# Patient Record
Sex: Female | Born: 1975 | Race: White | Hispanic: No | Marital: Married | Smoking: Current every day smoker
Health system: Southern US, Community
[De-identification: ages and names within clinical notes are randomized; demographics above are authoritative.]

## PROBLEM LIST (undated history)

## (undated) DIAGNOSIS — E119 Type 2 diabetes mellitus without complications: Secondary | ICD-10-CM

## (undated) DIAGNOSIS — R7303 Prediabetes: Secondary | ICD-10-CM

## (undated) DIAGNOSIS — E2839 Other primary ovarian failure: Secondary | ICD-10-CM

## (undated) DIAGNOSIS — K219 Gastro-esophageal reflux disease without esophagitis: Secondary | ICD-10-CM

## (undated) DIAGNOSIS — Z8719 Personal history of other diseases of the digestive system: Secondary | ICD-10-CM

## (undated) DIAGNOSIS — T7840XA Allergy, unspecified, initial encounter: Secondary | ICD-10-CM

## (undated) DIAGNOSIS — F17200 Nicotine dependence, unspecified, uncomplicated: Secondary | ICD-10-CM

## (undated) DIAGNOSIS — N281 Cyst of kidney, acquired: Secondary | ICD-10-CM

## (undated) DIAGNOSIS — E7439 Other disorders of intestinal carbohydrate absorption: Secondary | ICD-10-CM

## (undated) DIAGNOSIS — I10 Essential (primary) hypertension: Secondary | ICD-10-CM

## (undated) HISTORY — DX: Type 2 diabetes mellitus without complications: E11.9

## (undated) HISTORY — DX: Gastro-esophageal reflux disease without esophagitis: K21.9

## (undated) HISTORY — PX: CHOLECYSTECTOMY: SHX55

## (undated) HISTORY — DX: Essential (primary) hypertension: I10

## (undated) HISTORY — DX: Other primary ovarian failure: E28.39

## (undated) HISTORY — PX: MANDIBLE FRACTURE SURGERY: SHX706

## (undated) HISTORY — DX: Other disorders of intestinal carbohydrate absorption: E74.39

## (undated) HISTORY — DX: Allergy, unspecified, initial encounter: T78.40XA

## (undated) HISTORY — PX: ABDOMINAL HYSTERECTOMY: SHX81

## (undated) HISTORY — DX: Nicotine dependence, unspecified, uncomplicated: F17.200

## (undated) HISTORY — DX: Cyst of kidney, acquired: N28.1

## (undated) HISTORY — DX: Personal history of other diseases of the digestive system: Z87.19

---

## 1998-02-16 ENCOUNTER — Other Ambulatory Visit: Admission: RE | Admit: 1998-02-16 | Discharge: 1998-02-16 | Payer: Self-pay | Admitting: Obstetrics and Gynecology

## 1998-08-28 ENCOUNTER — Encounter: Admission: RE | Admit: 1998-08-28 | Discharge: 1998-11-26 | Payer: Self-pay | Admitting: Obstetrics and Gynecology

## 1998-11-04 ENCOUNTER — Inpatient Hospital Stay (HOSPITAL_COMMUNITY): Admission: AD | Admit: 1998-11-04 | Discharge: 1998-11-07 | Payer: Self-pay | Admitting: Obstetrics and Gynecology

## 1998-12-15 ENCOUNTER — Other Ambulatory Visit: Admission: RE | Admit: 1998-12-15 | Discharge: 1998-12-15 | Payer: Self-pay | Admitting: Obstetrics and Gynecology

## 1999-10-20 ENCOUNTER — Encounter: Payer: Self-pay | Admitting: Family Medicine

## 1999-10-20 ENCOUNTER — Ambulatory Visit (HOSPITAL_COMMUNITY): Admission: RE | Admit: 1999-10-20 | Discharge: 1999-10-20 | Payer: Self-pay | Admitting: Family Medicine

## 2000-02-03 ENCOUNTER — Other Ambulatory Visit: Admission: RE | Admit: 2000-02-03 | Discharge: 2000-02-03 | Payer: Self-pay | Admitting: *Deleted

## 2000-02-23 ENCOUNTER — Encounter: Payer: Self-pay | Admitting: *Deleted

## 2000-02-23 ENCOUNTER — Encounter: Admission: RE | Admit: 2000-02-23 | Discharge: 2000-02-23 | Payer: Self-pay | Admitting: *Deleted

## 2001-03-19 ENCOUNTER — Other Ambulatory Visit: Admission: RE | Admit: 2001-03-19 | Discharge: 2001-03-19 | Payer: Self-pay | Admitting: Obstetrics and Gynecology

## 2001-03-19 ENCOUNTER — Other Ambulatory Visit: Admission: RE | Admit: 2001-03-19 | Discharge: 2001-03-19 | Payer: Self-pay | Admitting: *Deleted

## 2002-04-25 ENCOUNTER — Other Ambulatory Visit: Admission: RE | Admit: 2002-04-25 | Discharge: 2002-04-25 | Payer: Self-pay | Admitting: Obstetrics and Gynecology

## 2003-04-28 ENCOUNTER — Other Ambulatory Visit: Admission: RE | Admit: 2003-04-28 | Discharge: 2003-04-28 | Payer: Self-pay | Admitting: Obstetrics and Gynecology

## 2003-09-02 ENCOUNTER — Emergency Department (HOSPITAL_COMMUNITY): Admission: EM | Admit: 2003-09-02 | Discharge: 2003-09-02 | Payer: Self-pay | Admitting: Emergency Medicine

## 2005-10-03 ENCOUNTER — Encounter: Admission: RE | Admit: 2005-10-03 | Discharge: 2006-01-01 | Payer: Self-pay | Admitting: Obstetrics and Gynecology

## 2005-11-25 ENCOUNTER — Inpatient Hospital Stay (HOSPITAL_COMMUNITY): Admission: AD | Admit: 2005-11-25 | Discharge: 2005-11-25 | Payer: Self-pay | Admitting: Obstetrics & Gynecology

## 2006-01-16 ENCOUNTER — Inpatient Hospital Stay (HOSPITAL_COMMUNITY): Admission: AD | Admit: 2006-01-16 | Discharge: 2006-01-19 | Payer: Self-pay | Admitting: Obstetrics and Gynecology

## 2006-01-16 ENCOUNTER — Encounter (INDEPENDENT_AMBULATORY_CARE_PROVIDER_SITE_OTHER): Payer: Self-pay | Admitting: *Deleted

## 2006-05-29 ENCOUNTER — Encounter: Admission: RE | Admit: 2006-05-29 | Discharge: 2006-05-29 | Payer: Self-pay | Admitting: Gastroenterology

## 2006-06-08 ENCOUNTER — Encounter: Admission: RE | Admit: 2006-06-08 | Discharge: 2006-06-08 | Payer: Self-pay | Admitting: Gastroenterology

## 2006-07-20 ENCOUNTER — Ambulatory Visit (HOSPITAL_COMMUNITY): Admission: RE | Admit: 2006-07-20 | Discharge: 2006-07-21 | Payer: Self-pay | Admitting: Surgery

## 2006-07-20 ENCOUNTER — Encounter (INDEPENDENT_AMBULATORY_CARE_PROVIDER_SITE_OTHER): Payer: Self-pay | Admitting: Surgery

## 2006-07-26 ENCOUNTER — Emergency Department (HOSPITAL_COMMUNITY): Admission: EM | Admit: 2006-07-26 | Discharge: 2006-07-26 | Payer: Self-pay | Admitting: Emergency Medicine

## 2008-05-14 ENCOUNTER — Encounter: Payer: Self-pay | Admitting: Gastroenterology

## 2008-05-15 ENCOUNTER — Encounter: Payer: Self-pay | Admitting: Gastroenterology

## 2008-05-16 ENCOUNTER — Encounter: Payer: Self-pay | Admitting: Gastroenterology

## 2008-05-16 ENCOUNTER — Telehealth (INDEPENDENT_AMBULATORY_CARE_PROVIDER_SITE_OTHER): Payer: Self-pay | Admitting: *Deleted

## 2008-05-16 DIAGNOSIS — R109 Unspecified abdominal pain: Secondary | ICD-10-CM | POA: Insufficient documentation

## 2008-05-19 ENCOUNTER — Encounter: Payer: Self-pay | Admitting: Gastroenterology

## 2008-06-12 ENCOUNTER — Ambulatory Visit (HOSPITAL_COMMUNITY): Admission: RE | Admit: 2008-06-12 | Discharge: 2008-06-12 | Payer: Self-pay | Admitting: Gastroenterology

## 2008-06-12 ENCOUNTER — Ambulatory Visit: Payer: Self-pay | Admitting: Gastroenterology

## 2008-11-27 ENCOUNTER — Emergency Department (HOSPITAL_COMMUNITY): Admission: EM | Admit: 2008-11-27 | Discharge: 2008-11-27 | Payer: Self-pay | Admitting: Emergency Medicine

## 2009-03-13 ENCOUNTER — Ambulatory Visit (HOSPITAL_COMMUNITY): Admission: RE | Admit: 2009-03-13 | Discharge: 2009-03-13 | Payer: Self-pay | Admitting: Obstetrics and Gynecology

## 2009-07-30 ENCOUNTER — Emergency Department (HOSPITAL_COMMUNITY): Admission: EM | Admit: 2009-07-30 | Discharge: 2009-07-30 | Payer: Self-pay | Admitting: Family Medicine

## 2009-11-25 ENCOUNTER — Encounter (INDEPENDENT_AMBULATORY_CARE_PROVIDER_SITE_OTHER): Payer: Self-pay | Admitting: Obstetrics and Gynecology

## 2009-11-25 ENCOUNTER — Ambulatory Visit (HOSPITAL_COMMUNITY): Admission: RE | Admit: 2009-11-25 | Discharge: 2009-11-26 | Payer: Self-pay | Admitting: Obstetrics and Gynecology

## 2010-02-07 ENCOUNTER — Encounter: Payer: Self-pay | Admitting: Obstetrics and Gynecology

## 2010-02-22 ENCOUNTER — Other Ambulatory Visit: Payer: Self-pay | Admitting: Family Medicine

## 2010-02-22 DIAGNOSIS — M25569 Pain in unspecified knee: Secondary | ICD-10-CM

## 2010-02-26 ENCOUNTER — Other Ambulatory Visit: Payer: Self-pay | Admitting: Family Medicine

## 2010-02-26 ENCOUNTER — Other Ambulatory Visit: Payer: Self-pay

## 2010-02-26 ENCOUNTER — Ambulatory Visit
Admission: RE | Admit: 2010-02-26 | Discharge: 2010-02-26 | Disposition: A | Discharge status: Home / Self Care | Payer: 59 | Source: Ambulatory Visit | Attending: Family Medicine | Admitting: Family Medicine

## 2010-02-26 DIAGNOSIS — M25569 Pain in unspecified knee: Secondary | ICD-10-CM

## 2010-03-31 LAB — CBC
HCT: 34.6 % — ABNORMAL LOW (ref 36.0–46.0)
HCT: 39.7 % (ref 36.0–46.0)
Hemoglobin: 13.4 g/dL (ref 12.0–15.0)
MCH: 31.7 pg (ref 26.0–34.0)
MCHC: 33.7 g/dL (ref 30.0–36.0)
MCV: 93.9 fL (ref 78.0–100.0)
Platelets: 233 10*3/uL (ref 150–400)
Platelets: 303 K/uL (ref 150–400)
RBC: 4.23 MIL/uL (ref 3.87–5.11)
RDW: 12.5 % (ref 11.5–15.5)
RDW: 12.7 % (ref 11.5–15.5)
WBC: 12.6 10*3/uL — ABNORMAL HIGH (ref 4.0–10.5)
WBC: 5 K/uL (ref 4.0–10.5)

## 2010-03-31 LAB — SURGICAL PCR SCREEN
MRSA, PCR: NEGATIVE
Staphylococcus aureus: POSITIVE — AB

## 2010-03-31 LAB — HCG, SERUM, QUALITATIVE: Preg, Serum: NEGATIVE

## 2010-04-09 LAB — CBC
Hemoglobin: 13.8 g/dL (ref 12.0–15.0)
MCHC: 34 g/dL (ref 30.0–36.0)
RBC: 4.28 MIL/uL (ref 3.87–5.11)

## 2010-04-29 ENCOUNTER — Other Ambulatory Visit: Payer: Self-pay | Admitting: Internal Medicine

## 2010-04-29 ENCOUNTER — Other Ambulatory Visit: Payer: 59

## 2010-04-29 DIAGNOSIS — N23 Unspecified renal colic: Secondary | ICD-10-CM

## 2010-04-29 DIAGNOSIS — M545 Low back pain: Secondary | ICD-10-CM

## 2010-05-03 ENCOUNTER — Other Ambulatory Visit: Payer: 59

## 2010-06-01 NOTE — Op Note (Signed)
[name], [name]            ACCOUNT NO.:  [id number]   MEDICAL RECORD NO.:  [id number]          PATIENT TYPE:  OIB   LOCATION:  5741                         FACILITY:  [facility]   PHYSICIAN:  Wilmon Arms. Corliss Skains, M.D. DATE OF BIRTH:  1975/03/02   DATE OF PROCEDURE:  07/20/2006  DATE OF DISCHARGE:                               OPERATIVE REPORT   PREOPERATIVE DIAGNOSIS:  1. Chronic acalculous cholecystitis.  2. External hemorrhoidal skin tag.   POSTOPERATIVE DIAGNOSIS:  1. Chronic acalculous cholecystitis.  2. External hemorrhoidal skin tag.   PROCEDURE PERFORMED:  1. Laparoscopic cholecystectomy.  2. Excision of external hemorrhoidal skin tag.   SURGEON:  Wilmon Arms. Corliss Skains, M.D.   ASSISTANT:  Cherylynn Ridges, M.D.   ANESTHESIA:  General endotracheal.   INDICATIONS:  The patient is a 35 year old female who was referred by  Dr. Bernette Redbird for several months of epigastric and right upper  quadrant abdominal pain, nausea, vomiting, bloating and belching.  This  is usually post prandial.  She has had a workup including an EGD.  Ultrasound showed no sign of gallstones.  At HIDA scan was borderline at  52%.  Due to her symptomatology and the borderline HIDA scan, she is  referred for elective cholecystectomy.  The patient also has an external  hemorrhoidal skin tag anteriorly for which she is requesting excision.   DESCRIPTION OF PROCEDURE:  The patient was brought to the operating and  placed in a supine position on the operating room table.  After an  adequate level of general anesthesia was obtained, the patient's legs  were placed in lithotomy position in the yellow fin stirrups.  Her  perineum was prepped with Betadine and draped in a sterile fashion.  The  external hemorrhoidal skin tag was seen.  The patient also has some  bilateral mildly swollen external hemorrhoids.  However, the anterior  skin tag is the largest.  These other hemorrhoids seemed a little more  acute.   The anterior skin tag was anesthetized with a 0.25% Marcaine.  The skin was opened with a scalpel.  Cautery was then used to dissect  the hemorrhoidal tissue way from the underlying sphincter.  The  hemorrhoid was sent for pathologic examination.  Cautery was used for  hemostasis.  The edges of the skin were reapproximated with 3-0 Vicryl.  A dry dressing was applied.  No internal masses were identified on the  internal examination.   We then moved to the patient's legs back to the supine position.  Her  abdomen was then prepped with Betadine and draped a in sterile fashion.  Her umbilicus was infiltrated 0.25% Marcaine.  A transverse incision was  made below the umbilicus.  Dissection was carried down to the fascia  which was opened vertically.  A stay suture of 0 Vicryl was placed  around the fascial opening.  The peritoneal cavity was bluntly entered.  Pneumoperitoneum is obtained by insufflating CO2 maintaining a maximal  pressure of 15 mmHg through the [facility] cannula.  We tilted the patient  to her left in reverse Trendelenburg position.  A 10 mm port was  placed  in the subxiphoid position and two 5 mL ports were placed in the right  upper quadrant.   The gallbladder was grasped with a clamp and elevated over the edge of  the liver.  There were some adhesions to the surface of the gallbladder.  These were taken down with cautery.  We opened peritoneum around the  hilum of the gallbladder.  We dissected around the cystic duct  circumferentially.  It was ligated and clipped distally.  The cystic  duct was quite small.  We then created a small opening in the cystic  duct.  However, we were unable to thread a Cook cholangiogram catheter  into the cystic duct due to its size and orientation.  Therefore, we  chose not to do a cholangiogram due to the small size of the cystic duct  as well as the normal liver function tests.  The cystic duct was ligated  with two clips distally and  divided.  The cystic artery was ligated  clips and divided.  Cautery was then used to remove the gallbladder from  the liver bed.  The gallbladder was placed in an EndoCatch sac.  We  inspected the right upper quadrant for hemostasis.  We irrigated  thoroughly.  No bleeding was noted.  The gallbladder was then removed through the  umbilical port site.  The stay suture was used to close umbilical  fascia.  4-0 Monocryl was used to close the skin incisions.  Steri-  Strips and clean dressings were applied.  The patient was then extubated  and brought to recovery in stable condition.  All sponge, instrument,  and needle counts were correct.      Wilmon Arms. Tsuei, M.D.  Electronically Signed     MKT/MEDQ  D:  07/20/2006  T:  07/20/2006  Job:  [id number]

## 2010-06-04 NOTE — Op Note (Signed)
[name], [name]            ACCOUNT NO.:  [id number]   MEDICAL RECORD NO.:  [id number]          PATIENT TYPE:  INP   LOCATION:  9103                          [facility]   PHYSICIAN:  Richardean Sale, M.D.   DATE OF BIRTH:  07-Feb-1975   DATE OF PROCEDURE:  01/16/2006  DATE OF DISCHARGE:                               OPERATIVE REPORT   PREOPERATIVE DIAGNOSIS:  A 39-week intrauterine pregnancy for repeat  cesarean section and desires permanent sterilization.   POSTOPERATIVE DIAGNOSIS:  A 39-week intrauterine pregnancy for repeat  cesarean section and desires permanent sterilization.   PROCEDURE:  Repeat low transverse cesarean section and bilateral partial  salpingectomy.   SURGEON:  Richardean Sale.   ASSISTANT:  Marie-Lyne Seymour Bars.   ANESTHESIA:  Spinal   COMPLICATIONS:  None.   BLOOD LOSS:  600 mL.   OPERATIVE FINDINGS:  Viable female infant cephalic presentation with  Apgars of 9 and 9.  Nuchal cord x1.  Intact placenta with three-vessel  cord sent to pathology.  Normal-appearing ovaries and fallopian tubes.   SPECIMENS:  Placenta and bilateral partial tubal segments sent to  pathology.   INDICATIONS:  Please see admission history and physical for details.  Briefly, this is a 35 year old white female, gravida 3, para 2 who is at  39-weeks' gestation with a history of 2 prior cesarean delivery who  presents today for repeat C-section.  The patient also desires permanent  sterilization, and she presents for bilateral tubal ligation as well.  Prior to the procedure, the risks, benefits, and alternatives of the  procedure were discussed with the patient in detail.  We discussed the  risks which include but are not limited to hemorrhage requiring  transfusion, infection, injury to the bowel-the bladder or other organs  which could require additional surgery, the possibility of tubal failure  and increased risk of ectopic pregnancy should pregnancy occur.  We also  discussed general surgical and anesthesia risks.  The patient voiced  understanding of all of the above and desires to proceed.  Informed  consent was obtained before proceeding to the OR.   PROCEDURE:  The patient was taken to the operating room where she was  given a spinal anesthetic.  She was then prepped and draped in the usual  sterile fashion with Betadine, and a Foley catheter was placed.  Ten mL  of half percent plain Marcaine were injected in the area where the  incision was to be made, and a Pfannenstiel skin incision was then made  through the patient's prior incision.  This was carried down sharply to  the fascia.  The fascia was incised in midline, and the fascial incision  was extended laterally with the Mayo scissors.  The superior and  inferior aspects of the fascial incision was then grasped with Kocher  clamps, elevated and the underlying muscles were dissected off with both  sharp and blunt dissection.  The muscles were then separated in the  midline.  The peritoneum was identified, grasped between two hemostats  and entered sharply.  This incision was then extended superiorly and  inferiorly with good visualization  of the bladder.  The bladder blade  was then inserted.  It was noted that the lower uterine was very thin.  The bladder flap was created by grasping the vesicouterine peritoneum.  This was incised and transected laterally, and the bladder flap was  carried out with both sharp and blunt dissection.  The bladder blade was  then reinserted, and the lower uterine segment was incised in a  transverse fashion with a scalpel.  This incision was then extended with  bandage scissors.  Amniotomy was performed, and the fluid was clear.  The infant's head was then delivered atraumatically.  Nuchal cord x1 was  reduced.  The nose and mouth were suctioned with a bulb, and infant was  then delivered to the sterile field.  The cord was clamped and cut.  The  infant was  handed off to the waiting NICU attendants with a vigorous  cry.  Apgars were 9 and 9.  Intact placenta with three-vessel cord was  then delivered and sent to pathology.  The uterus was then cleared of  all clot and debris, and the uterine incision was repaired with a  running locked chromic suture.  Additional sutures were placed to secure  hemostasis of the uterine incision.  Once this was secured, attention  was then turned to the patient's fallopian tube.  She again expressed a  desire for permanent sterilization.  The left fallopian tube was then  identified, carried out the fimbriated end, was grasped with a Babcock  clamp and a 3-cm segment of tube was then ligated and transected and  sent to pathology labeled as left tubal segment.  The same procedure was  then carried out on the right side.  Once hemostasis of both tubes was  confirmed, attention was then turned back to the uterine incision.  The  pelvis was irrigated copiously with warm normal saline.  Any areas of  bleeding were cauterized with a Bovie.  Once hemostasis of the uterine  incision was confirmed, the subfascial areas, muscular surfaces and  peritoneal surfaces were all inspected, and any areas of bleeding were  cauterized with a Bovie.  The fascia was then closed with a running  Vicryl suture.  Subcutaneous space was irrigated and any areas of  bleeding cauterized Bovie.  Given that it was greater than 2 cm in  depth, a 2-0 plain gut suture was then used to reapproximate  subcutaneous space, and the skin was then closed with staples.   The patient tolerated the procedure very well.  All sponge, lap, needle  and instrument counts were correct x2.  She was taken to the recovery  room awake and in stable condition.  There were no complications.      Richardean Sale, M.D.  Electronically Signed     JW/MEDQ  D:  01/16/2006  T:  01/16/2006  Job:  [id number]

## 2010-06-04 NOTE — Discharge Summary (Signed)
[name], [name]            ACCOUNT NO.:  [id number]   MEDICAL RECORD NO.:  [id number]          PATIENT TYPE:  INP   LOCATION:  9103                          [facility]   PHYSICIAN:  Richardean Sale, M.D.   DATE OF BIRTH:  January 21, 1975   DATE OF ADMISSION:  01/16/2006  DATE OF DISCHARGE:  01/19/2006                               DISCHARGE SUMMARY   ADMITTING DIAGNOSES:  A 39+ week intrauterine pregnancy with history of  prior cesarean delivery.  Desires repeat cesarean section and desires  permanent sterilization.   DISCHARGE DIAGNOSES:  1. A 39+ week intrauterine pregnancy with history of prior cesarean      delivery.  Desires repeat cesarean section and desires permanent      sterilization.  2. Status post repeat cesarean section and bilateral partial      salpingectomy.   PROCEDURES:  Repeat low transverse cesarean section and bilateral  partial salpingectomy performed on January 16, 2006.   OPERATIVE FINDINGS:  Viable female infant, cephalic presentation, Apgars  9 and 9, nuchal cord x1, intact placenta with three-vessel cord.  Normal-  appearing adnexa and uterus.   HISTORY OF PRESENT ILLNESS:  Please see admission History and Physical  for details.  Briefly, this is a 35 year old female with a history of  two prior cesarean deliveries who is now at 39 weeks.  She desires  repeat cesarean section and bilateral tubal ligation.   HOSPITAL COURSE:  The patient underwent an uncomplicated repeat cesarean  delivery and tubal ligation on January 16, 2006. Her postoperative  course was remarkable for onset of lower extremity edema, the right side  greater than left, as well as right leg pain.  The patient was otherwise  afebrile throughout her hospitalization. She underwent venous Doppler  studies of the right lower extremity to rule out DVT which were  negative.   The patient on postop day #3 was able to ambulate well, was tolerating  regular diet.  Her pain was  controlled oral pain medication, and her  bleeding was minimal. Given the negative Doppler study, she was  subsequently discharged to home on postop day #3.   CONDITION:  Stable.   FOLLOWUP:  She will follow up in 24 hours for staple removal as well as  a repeat blood pressure check as she had some mild blood pressure  elevations after delivery   INSTRUCTIONS:  She is to call for any further pain or redness in her  lower extremity, any redness or drainage from her incision, any  headache, visual change or epigastric pain, fever greater than 100.4 or  increasing pain   DISCHARGE MEDICATIONS:  1. Tylox 1-2 tablets every 4-6 hours p.r.n. pain.  2. Motrin 600 mg p.o. q.6 h. p.r.n. pain.  3. Continue prenatal vitamins.  4. Colace as needed   LABORATORY STUDIES:  Postop day #1, hemoglobin 10.0, hematocrit 29.6,  platelets 245, and white count 9.6.      Richardean Sale, M.D.  Electronically Signed     JW/MEDQ  D:  01/31/2006  T:  01/31/2006  Job:  [id number]

## 2010-11-02 LAB — CBC
HCT: 36.4
MCHC: 33.4
MCV: 82.9
Platelets: 366
RBC: 4.39
RBC: 4.93
WBC: 10.7 — ABNORMAL HIGH
WBC: 9.5

## 2010-11-02 LAB — COMPREHENSIVE METABOLIC PANEL
BUN: 12
CO2: 29
Chloride: 107
Creatinine, Ser: 0.61
GFR calc non Af Amer: >60
Glucose, Bld: 102 — ABNORMAL HIGH
Total Bilirubin: 0.3

## 2010-11-02 LAB — URINALYSIS, ROUTINE W REFLEX MICROSCOPIC
Bilirubin Urine: NEGATIVE
Ketones, ur: NEGATIVE
Nitrite: NEGATIVE
Specific Gravity, Urine: 1.011
Urobilinogen, UA: 0.2

## 2010-11-02 LAB — DIFFERENTIAL
Basophils Absolute: 0.1
Basophils Relative: 0
Lymphocytes Relative: 29
Lymphs Abs: 1.6
Monocytes Relative: 5
Neutro Abs: 6.1
Neutro Abs: 6.8
Neutrophils Relative %: 72

## 2010-11-02 LAB — I-STAT 8, (EC8 V) (CONVERTED LAB)
Acid-Base Excess: 1
BUN: 10
Chloride: 106
HCT: 46
Hemoglobin: 15.6 — ABNORMAL HIGH
Operator id: 270651
Sodium: 140
pCO2, Ven: 40.3 — ABNORMAL LOW

## 2010-11-02 LAB — HEPATIC FUNCTION PANEL
Alkaline Phosphatase: 99
Indirect Bilirubin: 0.3
Total Bilirubin: 0.5
Total Protein: 8.1

## 2010-11-02 LAB — LIPASE, BLOOD: Lipase: 23

## 2010-11-02 LAB — POCT I-STAT CREATININE: Operator id: 270651

## 2011-03-21 ENCOUNTER — Emergency Department (HOSPITAL_COMMUNITY)
Admission: EM | Admit: 2011-03-21 | Discharge: 2011-03-21 | Disposition: A | Discharge status: Home / Self Care | Payer: 59 | Attending: Emergency Medicine | Admitting: Emergency Medicine

## 2011-03-21 ENCOUNTER — Encounter (HOSPITAL_COMMUNITY): Payer: Self-pay | Admitting: *Deleted

## 2011-03-21 DIAGNOSIS — R109 Unspecified abdominal pain: Secondary | ICD-10-CM | POA: Insufficient documentation

## 2011-03-21 DIAGNOSIS — F17200 Nicotine dependence, unspecified, uncomplicated: Secondary | ICD-10-CM | POA: Insufficient documentation

## 2011-03-21 DIAGNOSIS — R112 Nausea with vomiting, unspecified: Secondary | ICD-10-CM | POA: Insufficient documentation

## 2011-03-21 LAB — POCT I-STAT, CHEM 8
Calcium, Ion: 1.09 mmol/L — ABNORMAL LOW (ref 1.12–1.32)
Glucose, Bld: 102 mg/dL — ABNORMAL HIGH (ref 70–99)
HCT: 42 % (ref 36.0–46.0)
Hemoglobin: 14.3 g/dL (ref 12.0–15.0)

## 2011-03-21 MED ORDER — MORPHINE SULFATE 4 MG/ML IJ SOLN
4.0000 mg | Freq: Once | INTRAMUSCULAR | Status: AC
  Administered 2011-03-21: 4 mg via INTRAVENOUS
  Filled 2011-03-21: qty 1

## 2011-03-21 MED ORDER — SODIUM CHLORIDE 0.9 % IV BOLUS (SEPSIS)
1000.0000 mL | Freq: Once | INTRAVENOUS | Status: AC
  Administered 2011-03-21: 1000 mL via INTRAVENOUS

## 2011-03-21 MED ORDER — METOCLOPRAMIDE HCL 10 MG PO TABS: 10.0000 mg | ORAL_TABLET | Freq: Four times a day (QID) | ORAL | Status: DC

## 2011-03-21 MED ORDER — HYOSCYAMINE SULFATE 0.5 MG/ML IJ SOLN
0.2500 mg | Freq: Once | INTRAMUSCULAR | Status: AC
  Administered 2011-03-21: 0.25 mg via INTRAVENOUS
  Filled 2011-03-21: qty 0.5

## 2011-03-21 MED ORDER — ONDANSETRON HCL 4 MG/2ML IJ SOLN
4.0000 mg | Freq: Once | INTRAMUSCULAR | Status: AC
  Administered 2011-03-21: 4 mg via INTRAVENOUS
  Filled 2011-03-21: qty 2

## 2011-03-21 NOTE — ED Provider Notes (Signed)
She feels improved, able to tolerate PO fluids without vomiting. She reports sheis ready to go home.   Rodena Medin, PA-C 03/21/11 1528

## 2011-03-21 NOTE — Discharge Instructions (Signed)
B.R.A.T. Diet Your doctor has recommended the B.R.A.T. diet for you or your child until the condition improves. This is often used to help control diarrhea and vomiting symptoms. If you or your child can tolerate clear liquids, you may have:  Bananas.   Rice.   Applesauce.   Toast (and other simple starches such as crackers, potatoes, noodles).  Be sure to avoid dairy products, meats, and fatty foods until symptoms are better. Fruit juices such as apple, grape, and prune juice can make diarrhea worse. Avoid these. Continue this diet for 2 days or as instructed by your caregiver. Document Released: 01/03/2005 Document Revised: 12/23/2010 Document Reviewed: 06/22/2006 [employer] Patient Information 2012 [employer]. 

## 2011-03-21 NOTE — ED Provider Notes (Signed)
History     CSN: [id number]  Arrival date & time 03/21/11  1040   First MD Initiated Contact with Patient 03/21/11 1130      Chief Complaint  Patient presents with  . Nausea  . Emesis  . Abdominal Pain    (Consider location/radiation/quality/duration/timing/severity/associated sxs/prior treatment) Patient is a 36 y.o. female presenting with vomiting and abdominal pain. The history is provided by the patient.  Emesis  Associated symptoms include abdominal pain. Pertinent negatives include no chills, no cough, no diarrhea, no fever and no headaches.  Abdominal Pain The primary symptoms of the illness include abdominal pain and vomiting. The primary symptoms of the illness do not include fever, shortness of breath, diarrhea, dysuria, vaginal discharge or vaginal bleeding.  Symptoms associated with the illness do not include chills, constipation or back pain.  pt with several episodes nv since last pm. Emesis clear, not bloody or bilious. Upper abd cramping comes and goes, no constant or focal abd pain. No flank pain. No gu c/o. No lower abd pain. Is having normal bms. No constipation or diarrhea. Daughter w recent nvd illness. No faintness. No cough or uri c/o. No cp or sob. No known bad food ingestion or new meds.  No fever or chills.   History reviewed. No pertinent past medical history.  Past Surgical History  Procedure Date  . Cesarean section   . Abdominal hysterectomy   . Cholecystectomy     No family history on file.  History  Substance Use Topics  . Smoking status: Current Everyday Smoker  . Smokeless tobacco: Not on file  . Alcohol Use: Yes    OB History    Grav Para Term Preterm Abortions TAB SAB Ect Mult Living                  Review of Systems  Constitutional: Negative for fever and chills.  HENT: Negative for sore throat and neck pain.   Eyes: Negative for redness.  Respiratory: Negative for cough and shortness of breath.   Cardiovascular: Negative for  chest pain.  Gastrointestinal: Positive for vomiting and abdominal pain. Negative for diarrhea and constipation.  Genitourinary: Negative for dysuria, flank pain, vaginal bleeding and vaginal discharge.  Musculoskeletal: Negative for back pain.  Skin: Negative for rash.  Neurological: Negative for headaches.  Hematological: Does not bruise/bleed easily.  Psychiatric/Behavioral: Negative for confusion.    Allergies  Review of patient's allergies indicates no known allergies.  Home Medications   Current Outpatient Rx  Name Route Sig Dispense Refill  . HYDROCODONE-ACETAMINOPHEN 5-500 MG PO TABS Oral Take 1-2 tablets by mouth every 6 (six) hours as needed. As needed for pain.    Marland Kitchen TRAMADOL HCL 50 MG PO TABS Oral Take 50 mg by mouth every 6 (six) hours as needed. As needed for pain.      BP 114/74  Pulse 113  Temp 98.3 F (36.8 C)  Resp 16  Ht 5\' 8"  (1.727 m)  Wt 190 lb (86.183 kg)  BMI 28.89 kg/m2  SpO2 98%  Physical Exam  Nursing note and vitals reviewed. Constitutional: She appears well-developed and well-nourished. No distress.  Eyes: Conjunctivae are normal. No scleral icterus.  Neck: Neck supple. No tracheal deviation present.  Cardiovascular: Regular rhythm, normal heart sounds and intact distal pulses.  Exam reveals no gallop and no friction rub.   No murmur heard. Pulmonary/Chest: Effort normal and breath sounds normal. No respiratory distress.  Abdominal: Soft. Normal appearance and bowel sounds are normal.  She exhibits no distension and no mass. There is no tenderness. There is no rebound and no guarding.  Genitourinary:       No cva tenderness  Musculoskeletal: She exhibits no edema.  Neurological: She is alert.  Skin: Skin is warm and dry. No rash noted.  Psychiatric: She has a normal mood and affect.    ED Course  Procedures (including critical care time)  Results for orders placed during the hospital encounter of 03/21/11  POCT I-STAT, CHEM 8       Component Value Range   Sodium 142  135 - 145 (mEq/L)   Potassium 3.5  3.5 - 5.1 (mEq/L)   Chloride 105  96 - 112 (mEq/L)   BUN 13  6 - 23 (mg/dL)   Creatinine, Ser 1.30  0.50 - 1.10 (mg/dL)   Glucose, Bld 865 (*) 70 - 99 (mg/dL)   Calcium, Ion 7.84 (*) 1.12 - 1.32 (mmol/L)   TCO2 26  0 - 100 (mmol/L)   Hemoglobin 14.3  12.0 - 15.0 (g/dL)   HCT 69.6  29.5 - 28.4 (%)      MDM  Iv ns bolus. Morphine iv. zofran iv.   2nd liter ns. Will move to cdu for ivf, discussed plan w cdu pa.     Suzi Roots, MD 03/22/11 [phone]

## 2011-03-21 NOTE — ED Notes (Signed)
Patient reports onset of abd pain and n/v last night.  She thinks she was exposed to stomach bug by her daughter.  She states she is thirsty and she has neck and back pain

## 2011-03-21 NOTE — ED Notes (Addendum)
Pt resting. States she feels little better but still having cramping in abdomen and back. Pt moving to CDU 7

## 2011-03-21 NOTE — ED Notes (Signed)
Patient states nausea and emesis multiple episodes after eating or drinking water.  States pain is cramping in abdomen 7-10/10 intermittent.  Denies diarrhea.  Patient child recently had diarrhea and found out today another child is becoming sick.  Patient ax4 airway intact bilateral equal chest rise and fall.

## 2011-03-22 NOTE — ED Provider Notes (Signed)
Medical screening examination/treatment/procedure(s) were conducted as a shared visit with non-physician practitioner(s) and myself.  I personally evaluated the patient during the encounter   Suzi Roots, MD 03/22/11 1435

## 2011-06-15 ENCOUNTER — Emergency Department (HOSPITAL_COMMUNITY)
Admission: EM | Admit: 2011-06-15 | Discharge: 2011-06-15 | Disposition: A | Discharge status: Home / Self Care | Payer: Self-pay | Attending: Emergency Medicine | Admitting: Emergency Medicine

## 2011-06-15 ENCOUNTER — Encounter (HOSPITAL_COMMUNITY): Payer: Self-pay | Admitting: Emergency Medicine

## 2011-06-15 ENCOUNTER — Emergency Department (HOSPITAL_COMMUNITY): Payer: Self-pay

## 2011-06-15 DIAGNOSIS — M25539 Pain in unspecified wrist: Secondary | ICD-10-CM | POA: Insufficient documentation

## 2011-06-15 DIAGNOSIS — X58XXXA Exposure to other specified factors, initial encounter: Secondary | ICD-10-CM | POA: Insufficient documentation

## 2011-06-15 DIAGNOSIS — S63509A Unspecified sprain of unspecified wrist, initial encounter: Secondary | ICD-10-CM | POA: Insufficient documentation

## 2011-06-15 NOTE — Discharge Instructions (Signed)
Wear the splint for comfort. Take ibuprofen for pain. See the Dr. of your choice for problems.   Joint Sprain A sprain is a tear or stretch in the ligaments that hold a joint together. Severe sprains may need as long as 3-6 weeks of immobilization and/or exercises to heal completely. Sprained joints should be rested and protected. If not, they can become unstable and prone to re-injury. Proper treatment can reduce your pain, shorten the period of disability, and reduce the risk of repeated injuries. TREATMENT   Rest and elevate the injured joint to reduce pain and swelling.   Apply ice packs to the injury for 20-30 minutes every 2-3 hours for the next 2-3 days.   Keep the injury wrapped in a compression bandage or splint as long as the joint is painful or as instructed by your caregiver.   Do not use the injured joint until it is completely healed to prevent re-injury and chronic instability. Follow the instructions of your caregiver.   Long-term sprain management may require exercises and/or treatment by a physical therapist. Taping or special braces may help stabilize the joint until it is completely better.  SEEK MEDICAL CARE IF:   You develop increased pain or swelling of the joint.   You develop increasing redness and warmth of the joint.   You develop a fever.   It becomes stiff.   Your hand or foot gets cold or numb.  Document Released: 02/11/2004 Document Revised: 12/23/2010 Document Reviewed: 01/21/2008 [facility] Patient Information 2012 [place], [place].  RESOURCE GUIDE  Dental Problems  Patients with Medicaid: [facility] [phone] [address]                                           [phone] [address] Phone:  [phone]                                                  Phone:  [phone]  If unable to pay or uninsured, contact:  [facility] or [facility]. to become qualified for the adult dental  clinic.  Chronic Pain Problems Contact [facility]  [phone] Patients need to be referred by their primary care doctor.  Insufficient Money for Medicine Contact [employer]:  call "211" or [facility] [phone].  No Primary Care Doctor Call [facility]  [phone] Other agencies that provide inexpensive medical care    [facility]  [phone]    [facility] Internal Medicine  [phone]    [facility]  [phone]    [facility]  [phone]    [facility]  [phone]    [facility] Child Clinic  [phone]  Psychological Services [facility] Behavioral Health  [phone] [facility] Services  [phone] [facility] Mental Health   [phone] (emergency services 9125166869)  Substance Abuse Resources [facility]  [phone] [facility] [phone] [facility] [phone] Floydene Flock [phone] Residential & Outpatient Substance Abuse Program  [phone]  Abuse/Neglect [facility] Child Abuse Hotline 940-466-3101 [facility] Child Abuse Hotline 204-209-4343 (After Hours)  Emergency Shelter [employer] ([phone]  [facility] [phone] [facility] [phone]  MRSA Hotline #:   506-418-5288    [employer] [facility] Resources  [facility]     [employer]                          [facility] Dept. [address] [place]                       [address]      [address]  Blondell Reveal Phone:  [phone]                                   Phone:  [phone]                 Phone:  [phone]  [facility] Mental Health Phone:  [phone]  [facility] Child Abuse Hotline 306-044-4043 [phone] (After Hours)

## 2011-06-15 NOTE — ED Notes (Signed)
Discharge instructions reviewed with pt; verbalizes understanding.  No questions asked; no further c/o's voiced.  Pt ambulatory to lobby.  NAD noted. 

## 2011-06-15 NOTE — ED Notes (Signed)
Pt st's she was pressing down on something this am and felt a pop in left wrist.  C/o continued pain since then

## 2011-06-15 NOTE — Progress Notes (Signed)
Orthopedic Tech Progress Note Patient Details:  [name] 1975/10/16 [id number]  Ortho Devices Type of Ortho Device: Velcro wrist splint Ortho Device/Splint Location: (L) UE Ortho Device/Splint Interventions: Application   [name] 06/15/2011, 6:00 PM

## 2011-06-15 NOTE — ED Provider Notes (Signed)
History   This chart was scribed for Flint Melter, MD by Brooks Sailors. The patient was seen in room STRE8/STRE8. Patient's care was started at 1446.   CSN: [id number]  Arrival date & time 06/15/11  1446   First MD Initiated Contact with Patient 06/15/11 1735      Chief Complaint  Patient presents with  . Wrist Injury    (Consider location/radiation/quality/duration/timing/severity/associated sxs/prior treatment) HPI [name] is a 36 y.o. female who presents to the Emergency Department complaining of left wrist pain onset this morning. Patient was applying pressure to break up noodles and hit wrist wrong. Patient had shooting pain all the way up to her elbow. Pain has been persistent since onset. Nothing makes pain better. Movement aggravates pain.    History reviewed. No pertinent past medical history.  Past Surgical History  Procedure Date  . Cesarean section   . Abdominal hysterectomy   . Cholecystectomy     No family history on file.  History  Substance Use Topics  . Smoking status: Current Everyday Smoker  . Smokeless tobacco: Not on file  . Alcohol Use: Yes    OB History    Grav Para Term Preterm Abortions TAB SAB Ect Mult Living                  Review of Systems  All other systems reviewed and are negative.    Allergies  Review of patient's allergies indicates no known allergies.  Home Medications   Current Outpatient Rx  Name Route Sig Dispense Refill  . HYDROCODONE-ACETAMINOPHEN 5-500 MG PO TABS Oral Take 1-2 tablets by mouth every 6 (six) hours as needed. As needed for pain.    . IBUPROFEN 200 MG PO TABS Oral Take 600-800 mg by mouth every 6 (six) hours as needed. For pain    . THERA M PLUS PO TABS Oral Take 1 tablet by mouth daily.    Marland Kitchen METOCLOPRAMIDE HCL 10 MG PO TABS Oral Take 1 tablet (10 mg total) by mouth every 6 (six) hours. 15 tablet 0    BP 129/88  Pulse 96  Temp(Src) 98.7 F (37.1 C) (Oral)  Resp 18  SpO2  100%  Physical Exam  Nursing note and vitals reviewed. Constitutional: She is oriented to person, place, and time. She appears well-developed and well-nourished. No distress.  HENT:  Head: Normocephalic and atraumatic.  Eyes: EOM are normal.  Neck: Neck supple. No tracheal deviation present.  Cardiovascular: Normal rate.   Pulmonary/Chest: Effort normal. No respiratory distress.  Abdominal: She exhibits no distension.  Musculoskeletal: Normal range of motion. She exhibits tenderness. She exhibits no edema.       Tenderness over the lunate. Distal ulnar tenderness. Tenderness dorsal ulnar wrist.   Neurological: She is alert and oriented to person, place, and time. No sensory deficit.  Skin: Skin is warm and dry.  Psychiatric: She has a normal mood and affect. Her behavior is normal.    ED Course  Procedures (including critical care time)  1745 Pt seen and evaluated. X-ray of wrist completed 1800 Pt informed of x-ray results. Given wrist splint for comfort   Labs Reviewed - No data to display Dg Wrist Complete Left  06/15/2011  *RADIOLOGY REPORT*  Clinical Data: Posterior left wrist pain.  LEFT WRIST - COMPLETE 3+ VIEW  Comparison: None available.  Findings: No acute bone or soft tissue abnormality is present.  The left wrist is located.  IMPRESSION: Negative left wrist.  Original  Report Authenticated By: Jamesetta Orleans. MATTERN, M.D.     1. Wrist sprain       MDM: Wrist sprain without fracture. Stable for discharge.  Plan: Home Medications-  ibuprofen ; Home TreatmentSplint when necessarys- ; Recommended follow up- PCP prn    I personally performed the services described in this documentation, which was scribed in my presence. The recorded information has been reviewed and considered.     Flint Melter, MD 06/15/11 2122

## 2011-12-14 ENCOUNTER — Telehealth: Payer: Self-pay

## 2011-12-14 NOTE — Telephone Encounter (Signed)
PT SAW [place] FOR W/C.  SHE SAID THE MEDICINE WAS NOT HELPING AND WANTED TO KNOW IF WE COULD CALL SOMETHING ELSE IN.  CALL [phone]

## 2011-12-14 NOTE — Telephone Encounter (Signed)
ERROR - THIS WAS SUPPOSED TO BE PLACED IN [facility] PHONE MESSAGES BECAUSE IT IS A W/C

## 2012-01-03 ENCOUNTER — Telehealth: Payer: Self-pay

## 2012-01-03 NOTE — Telephone Encounter (Signed)
Error

## 2012-01-19 ENCOUNTER — Telehealth: Payer: Self-pay

## 2012-01-19 NOTE — Telephone Encounter (Signed)
Pt is requesting HYDROcodone-acetaminophen (VICODIN) 5-500 MG per tablet [phone] (H)

## 2012-01-19 NOTE — Telephone Encounter (Signed)
Can you put this in [facility]man? I need chart.

## 2012-01-31 ENCOUNTER — Other Ambulatory Visit (HOSPITAL_COMMUNITY): Payer: Self-pay | Admitting: Internal Medicine

## 2012-01-31 ENCOUNTER — Ambulatory Visit (HOSPITAL_COMMUNITY)
Admission: RE | Admit: 2012-01-31 | Discharge: 2012-01-31 | Disposition: A | Discharge status: Home / Self Care | Payer: Self-pay | Source: Ambulatory Visit | Attending: Internal Medicine | Admitting: Internal Medicine

## 2012-01-31 DIAGNOSIS — N83209 Unspecified ovarian cyst, unspecified side: Secondary | ICD-10-CM | POA: Insufficient documentation

## 2012-01-31 DIAGNOSIS — Z90710 Acquired absence of both cervix and uterus: Secondary | ICD-10-CM | POA: Insufficient documentation

## 2012-01-31 DIAGNOSIS — R1032 Left lower quadrant pain: Secondary | ICD-10-CM

## 2012-01-31 DIAGNOSIS — N9489 Other specified conditions associated with female genital organs and menstrual cycle: Secondary | ICD-10-CM

## 2012-02-29 ENCOUNTER — Encounter: Payer: 59 | Admitting: Obstetrics & Gynecology

## 2012-03-14 ENCOUNTER — Ambulatory Visit (INDEPENDENT_AMBULATORY_CARE_PROVIDER_SITE_OTHER): Payer: Self-pay | Admitting: Obstetrics & Gynecology

## 2012-03-14 ENCOUNTER — Encounter: Payer: Self-pay | Admitting: Obstetrics & Gynecology

## 2012-03-14 VITALS — BP 139/91 | HR 83 | Temp 98.8°F | Ht 66.0 in | Wt 172.9 lb

## 2012-03-14 DIAGNOSIS — R109 Unspecified abdominal pain: Secondary | ICD-10-CM

## 2012-03-14 NOTE — Progress Notes (Signed)
Patient ID: [name], female   DOB: 08/09/1975, 37 y.o.   MRN: [id number]  Chief Complaint  Patient presents with  . Referral    from [facility]  . Pelvic Pain    HPI [name] is a 37 y.o. female.  [id number] No LMP recorded. Patient has had a hysterectomy. LLQ pain increasing over few months, may last for 1.5 weeks and may be cyclical but worse when standing, walking, at work. Feels some LLQ pressure which is relieved by holding that area.  HPI  History reviewed. No pertinent past medical history.  Past Surgical History  Procedure Laterality Date  . Cesarean section    . Abdominal hysterectomy    . Cholecystectomy    . Mandible fracture surgery    RATH 2011 Dr. Billy Coast, LSO, LOA, cystectomy repaired. Resection of endometriosis from l side of CS scar at [facility]. Has 3 CS Endometrial ablation Family History  Problem Relation Age of Onset  . Hypertension Mother   . Hypertension Maternal Grandmother   . Cancer Maternal Grandmother   . Cancer Maternal Grandfather   . Hypertension Paternal Grandmother   . Diabetes Paternal Grandmother     Social History History  Substance Use Topics  . Smoking status: Current Every Day Smoker -- 0.50 packs/day for 15 years    Types: Cigarettes  . Smokeless tobacco: Not on file  . Alcohol Use: Yes    No Known Allergies  Current Outpatient Prescriptions  Medication Sig Dispense Refill  . cyclobenzaprine (FLEXERIL) 5 MG tablet Take 5 mg by mouth 3 (three) times daily as needed for muscle spasms.      Marland Kitchen HYDROcodone-acetaminophen (VICODIN) 5-500 MG per tablet Take 1-2 tablets by mouth every 6 (six) hours as needed. As needed for pain.      Marland Kitchen ibuprofen (ADVIL,MOTRIN) 200 MG tablet Take 600-800 mg by mouth every 6 (six) hours as needed. For pain      . Multiple Vitamins-Minerals (MULTIVITAMINS THER. W/MINERALS) TABS Take 1 tablet by mouth daily.      . metoCLOPramide (REGLAN) 10 MG tablet Take 1 tablet (10 mg total) by mouth  every 6 (six) hours.  15 tablet  0   No current facility-administered medications for this visit.    Review of Systems Review of Systems  Constitutional: Negative for fever and appetite change.  Gastrointestinal: Negative for vomiting, diarrhea and constipation.  Genitourinary: Positive for pelvic pain. Negative for dysuria, vaginal bleeding, vaginal discharge and vaginal pain.    Blood pressure 139/91, pulse 83, temperature 98.8 F (37.1 C), temperature source Oral, height 5\' 6"  (1.676 m), weight 172 lb 14.4 oz (78.427 kg).  Physical Exam Physical Exam  Constitutional: She appears well-nourished. No distress.  Pulmonary/Chest: Effort normal. No respiratory distress.  Abdominal: Soft. She exhibits no distension and no mass. There is tenderness (llq mild). There is no rebound and no guarding.  Genitourinary: Vagina normal. No vaginal discharge found.  No mass, moderate left side tenderness.   Skin: Skin is warm and dry.  Psychiatric: She has a normal mood and affect. Her behavior is normal.    Data Reviewed Addendum    [name], MD Tue Jan 31, 2012 5:12:07 PM EST       **ADDENDUM** CREATED: 01/31/2012 17:09:57  Impression #2 below contains a typographical error, and should read  "previous hysterectomy and left oophrectomy."  **END ADDENDUM** SIGNED BY: John A. Eppie Gibson, M.D.      Study Result    *RADIOLOGY REPORT*  Clinical Data: Left lower quadrant pain. Adnexal mass on exam.  Endometriosis. Previous hysterectomy.  TRANSABDOMINAL AND TRANSVAGINAL ULTRASOUND OF PELVIS  Technique: Both transabdominal and transvaginal ultrasound  examinations of the pelvis were performed. Transabdominal  technique was performed for global imaging of the pelvis including  uterus, ovaries, adnexal regions, and pelvic cul-de-sac.  It was necessary to proceed with endovaginal exam following the  transabdominal exam to visualize the vaginal cuff and ovaries.  Comparison: None.  Findings:   Uterus: Surgically absent. Vaginal cuff is unremarkable in  appearance.  Right ovary: A complex cyst is seen which contains hypoechoic  strands with concave Services, consistent with blood clot. No  internal blood flow is seen within this lesion on color Doppler  ultrasound. This measures 3.6 cm and is has characteristics  consistent with a benign hemorrhagic cyst.  Left ovary: Not visualized, consistent with history of unilateral  oophrectomy.  Other Findings: A small amount of free fluid noted.  IMPRESSION:  1. 3.6 cm benign appearing hemorrhagic right ovarian cyst. Small  amount of free fluid also noted.  2. Previous hysterectomy and left nephrectomy.  Original Report Authenticated By: Myles Rosenthal, M.D.         Assessment    LLQ pain, s/p hysterectomy and LSO, CS scar revision. 3.6 cm r ovarian cyst     Plan    CT scan, R/O hernia RTC for result Explained I would not Rx narcotic.        [name] 03/14/2012, 3:16 PM

## 2012-03-14 NOTE — Patient Instructions (Signed)
Pelvic Pain Pelvic pain is pain below the belly button and located between your hips. Acute pain may last a few hours or days. Chronic pelvic pain may last weeks and months. The cause may be different for different types of pain. The pain may be dull or sharp, mild or severe and can interfere with your daily activities. Write down and tell your caregiver:   Exactly where the pain is located.  If it comes and goes or is there all the time.  When it happens (with sex, urination, bowel movement, etc.)  If the pain is related to your menstrual period or stress. Your caregiver will take a full history and do a complete physical exam and Pap test. CAUSES   Painful menstrual periods (dysmenorrhea).  Normal ovulation (Mittelschmertz) that occurs in the middle of the menstrual cycle every month.  The pelvic organs get engorged with blood just before the menstrual period (pelvic congestive syndrome).  Scar tissue from an infection or past surgery (pelvic adhesions).  Cancer of the female pelvic organs. When there is pain with cancer, it has been there for a long time.  The lining of the uterus (endometrium) abnormally grows in places like the pelvis and on the pelvic organs (endometriosis).  A form of endometriosis with the lining of the uterus present inside of the muscle tissue of the uterus (adenomyosis).  Fibroid tumor (noncancerous) in the uterus.  Bladder problems such as infection, bladder spasms of the muscle tissue of the bladder.  Intestinal problems (irritable bowel syndrome, colitis, an ulcer or gastrointestinal infection).  Polyps of the cervix or uterus.  Pregnancy in the tube (ectopic pregnancy).  The opening of the cervix is too small for the menstrual blood to flow through it (cervical stenosis).  Physical or sexual abuse (past or present).  Musculo-skeletal problems from poor posture, problems with the vertebrae of the lower back or the uterine pelvic muscles falling  (prolapse).  Psychological problems such as depression or stress.  IUD (intrauterine device) in the uterus. DIAGNOSIS  Tests to make a diagnosis depends on the type, location, severity and what causes the pain to occur. Tests that may be needed include:  Blood tests.  Urine tests  Ultrasound.  X-rays.  CT Scan.  MRI.  Laparoscopy.  Major surgery. TREATMENT  Treatment will depend on the cause of the pain, which includes:  Prescription or over-the-counter pain medication.  Antibiotics.  Birth control pills.  Hormone treatment.  Nerve blocking injections.  Physical therapy.  Antidepressants.  Counseling with a psychiatrist or psychologist.  Minor or major surgery. HOME CARE INSTRUCTIONS   Only take over-the-counter or prescription medicines for pain, discomfort or fever as directed by your caregiver.  Follow your caregiver's advice to treat your pain.  Rest.  Avoid sexual intercourse if it causes the pain.  Apply warm or cold compresses (which ever works best) to the pain area.  Do relaxation exercises such as yoga or meditation.  Try acupuncture.  Avoid stressful situations.  Try group therapy.  If the pain is because of a stomach/intestinal upset, drink clear liquids, eat a bland light food diet until the symptoms go away. SEEK MEDICAL CARE IF:   You need stronger prescription pain medication.  You develop pain with sexual intercourse.  You have pain with urination.  You develop a temperature of 102 F (38.9 C) with the pain.  You are still in pain after 4 hours of taking prescription medication for the pain.  You need depression medication.    Your IUD is causing pain and you want it removed. SEEK IMMEDIATE MEDICAL CARE IF:  You develop very severe pain or tenderness.  You faint, have chills, severe weakness or dehydration.  You develop heavy vaginal bleeding or passing solid tissue.  You develop a temperature of 102 F (38.9 C)  with the pain.  You have blood in the urine.  You are being physically or sexually abused.  You have uncontrolled vomiting and diarrhea.  You are depressed and afraid of harming yourself or someone else. Document Released: 02/11/2004 Document Revised: 03/28/2011 Document Reviewed: 11/08/2007 Exit[employer] Patient Information 2013 [employer].  

## 2012-03-14 NOTE — Progress Notes (Signed)
Found cyst on R ovary (only ovary left) but has left sided pain. Says the pain feels like when she had endometriosis.

## 2012-03-27 ENCOUNTER — Ambulatory Visit (HOSPITAL_COMMUNITY): Admission: RE | Admit: 2012-03-27 | Payer: Self-pay | Source: Ambulatory Visit

## 2012-04-02 ENCOUNTER — Ambulatory Visit: Payer: Self-pay | Admitting: Obstetrics & Gynecology

## 2012-04-04 ENCOUNTER — Ambulatory Visit: Payer: Self-pay | Admitting: Obstetrics & Gynecology

## 2013-02-28 IMAGING — CR DG LUMBAR SPINE COMPLETE 4+V
5 series · 5 of 5 positions shown · non-contrast
Comparison: None.

CLINICAL DATA: Low back pain.  Remote history of fall.

LUMBAR SPINE - COMPLETE 4+ VIEW

[AP]
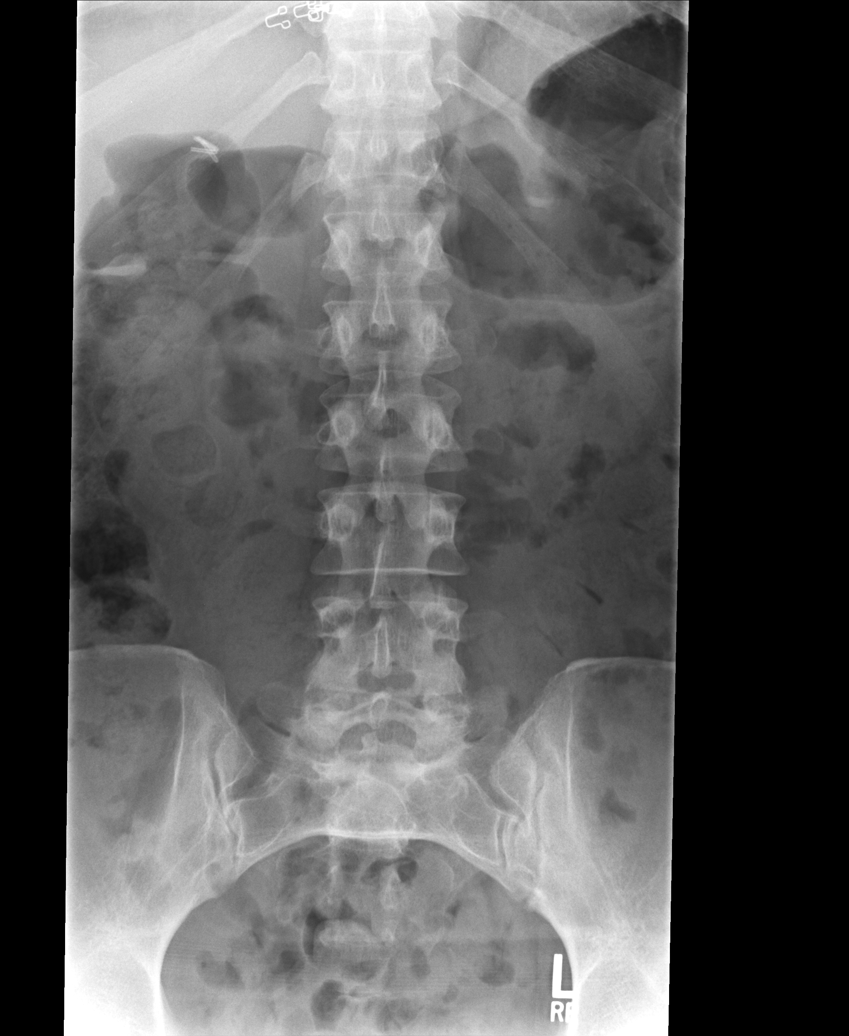

[rpo]
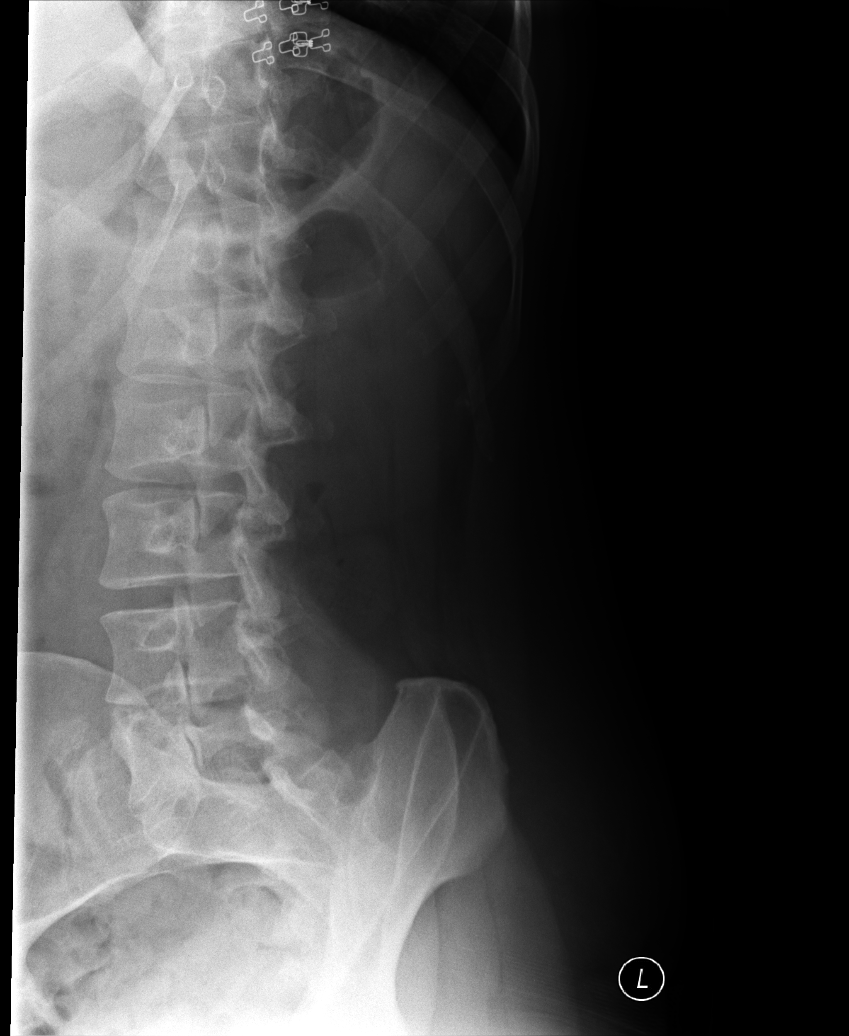

[lpo]
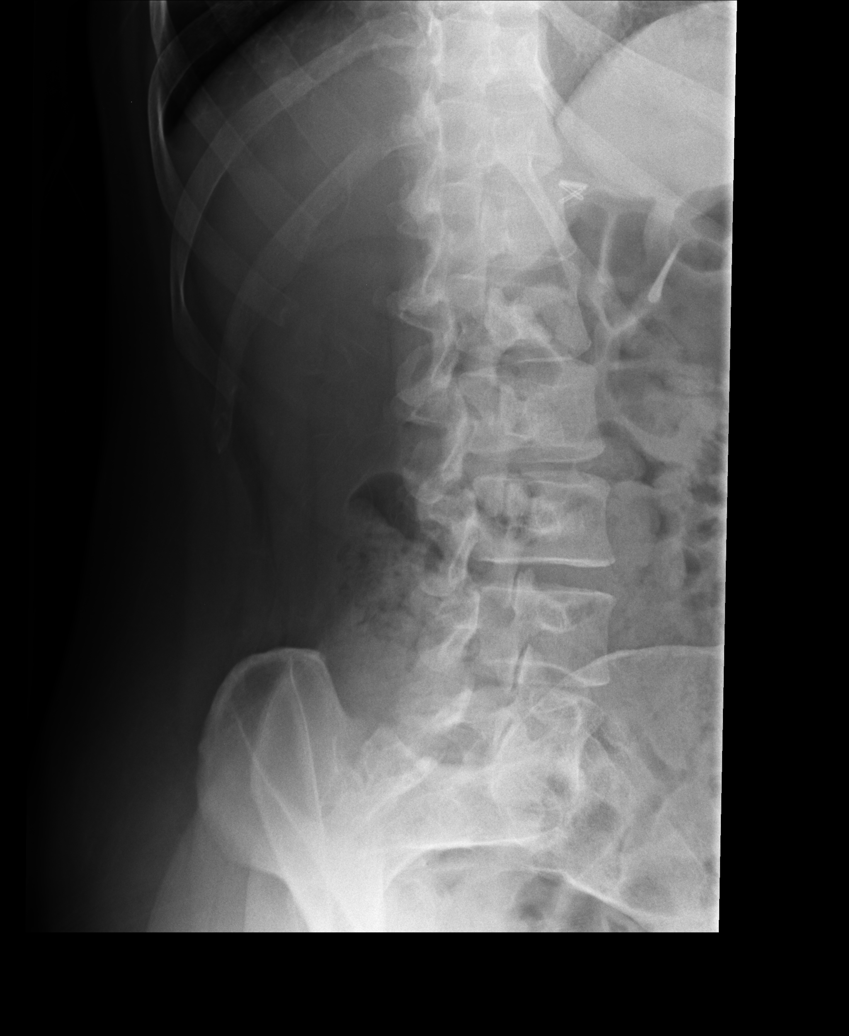

[lateral]
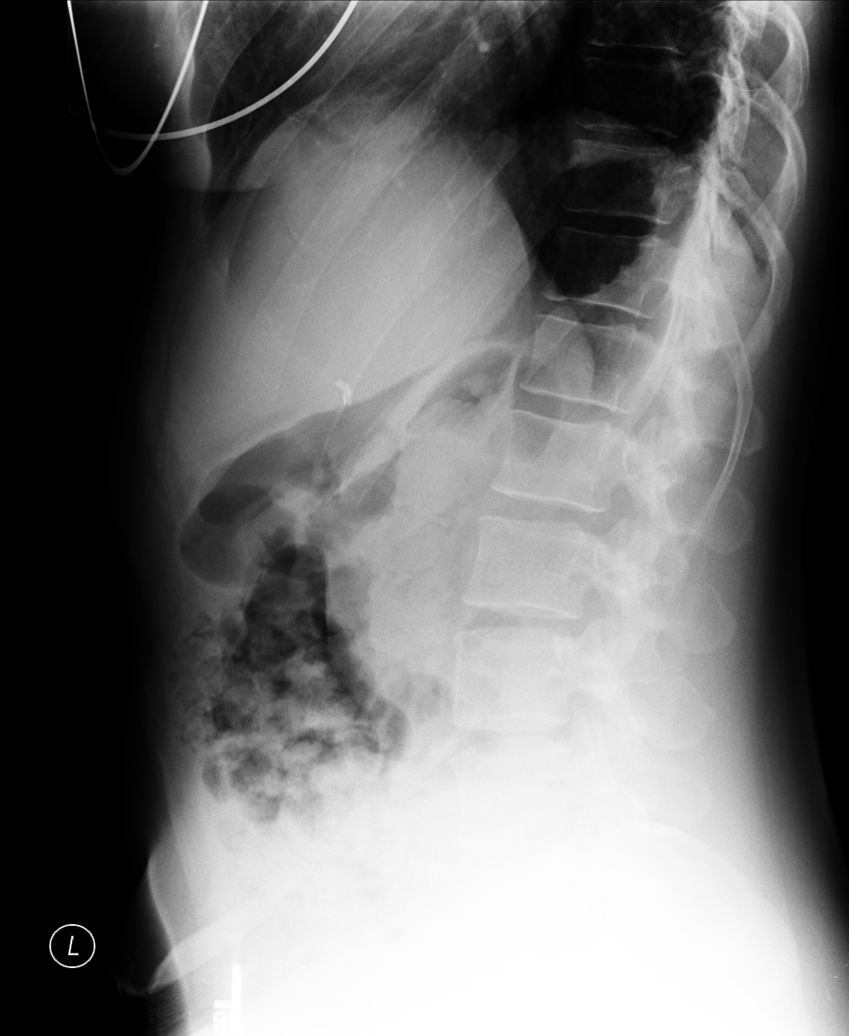

[l5 s1]
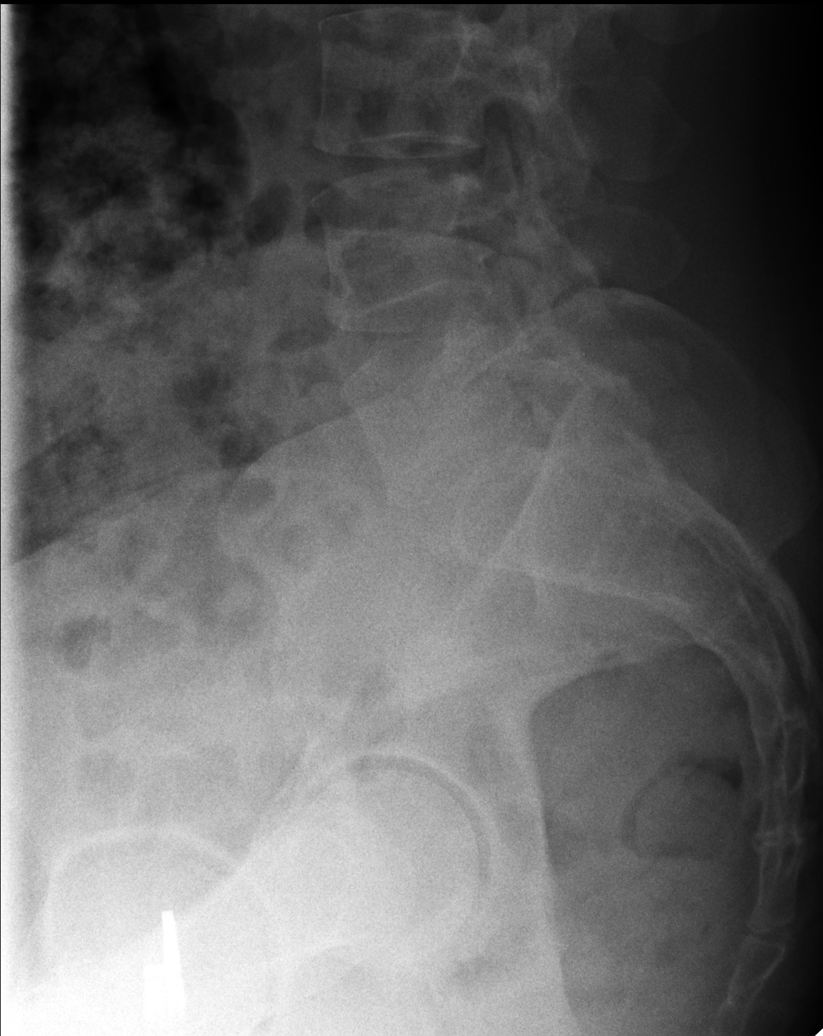

[5 of 5 positions shown; findings below may reference images not displayed]

FINDINGS: Early degenerative facet disease in the lower lumbar
spine.  No fracture or malalignment.  Disc spaces are maintained.
SI joints are symmetric and unremarkable.  The
IMPRESSION: Early degenerative facet disease at L5-S1.  No acute findings.

## 2013-11-18 ENCOUNTER — Encounter: Payer: Self-pay | Admitting: Obstetrics & Gynecology

## 2016-11-17 ENCOUNTER — Ambulatory Visit: Payer: Self-pay | Admitting: Family Medicine

## 2020-04-26 ENCOUNTER — Encounter (HOSPITAL_COMMUNITY): Payer: Self-pay | Admitting: Emergency Medicine

## 2020-04-26 ENCOUNTER — Emergency Department (HOSPITAL_COMMUNITY): Payer: Self-pay

## 2020-04-26 ENCOUNTER — Emergency Department (HOSPITAL_COMMUNITY)
Admission: EM | Admit: 2020-04-26 | Discharge: 2020-04-26 | Disposition: A | Discharge status: Home / Self Care | Payer: Self-pay | Attending: Emergency Medicine | Admitting: Emergency Medicine

## 2020-04-26 ENCOUNTER — Other Ambulatory Visit: Payer: Self-pay

## 2020-04-26 DIAGNOSIS — R10812 Left upper quadrant abdominal tenderness: Secondary | ICD-10-CM | POA: Insufficient documentation

## 2020-04-26 DIAGNOSIS — R1012 Left upper quadrant pain: Secondary | ICD-10-CM

## 2020-04-26 DIAGNOSIS — R110 Nausea: Secondary | ICD-10-CM | POA: Insufficient documentation

## 2020-04-26 LAB — URINALYSIS, ROUTINE W REFLEX MICROSCOPIC
Bilirubin Urine: NEGATIVE
Glucose, UA: NEGATIVE mg/dL
Ketones, ur: NEGATIVE mg/dL
Leukocytes,Ua: NEGATIVE
Nitrite: NEGATIVE
Protein, ur: NEGATIVE mg/dL
Specific Gravity, Urine: 1.015 (ref 1.005–1.030)
pH: 5 (ref 5.0–8.0)

## 2020-04-26 LAB — COMPREHENSIVE METABOLIC PANEL
ALT: 20 U/L (ref 0–44)
AST: 19 U/L (ref 15–41)
Albumin: 4.1 g/dL (ref 3.5–5.0)
Alkaline Phosphatase: 50 U/L (ref 38–126)
Anion gap: 7 (ref 5–15)
BUN: 11 mg/dL (ref 6–20)
CO2: 26 mmol/L (ref 22–32)
Calcium: 8.8 mg/dL — ABNORMAL LOW (ref 8.9–10.3)
Chloride: 104 mmol/L (ref 98–111)
Creatinine, Ser: 0.73 mg/dL (ref 0.44–1.00)
GFR, Estimated: >60 mL/min (ref >60)
Glucose, Bld: 125 mg/dL — ABNORMAL HIGH (ref 70–99)
Potassium: 3.7 mmol/L (ref 3.5–5.1)
Sodium: 137 mmol/L (ref 135–145)
Total Bilirubin: 0.6 mg/dL (ref 0.3–1.2)
Total Protein: 7.7 g/dL (ref 6.5–8.1)

## 2020-04-26 LAB — CBC
HCT: 44.9 % (ref 36.0–46.0)
Hemoglobin: 15.3 g/dL — ABNORMAL HIGH (ref 12.0–15.0)
MCH: 31.5 pg (ref 26.0–34.0)
MCHC: 34.1 g/dL (ref 30.0–36.0)
MCV: 92.4 fL (ref 80.0–100.0)
Platelets: 366 10*3/uL (ref 150–400)
RBC: 4.86 MIL/uL (ref 3.87–5.11)
RDW: 12.5 % (ref 11.5–15.5)
WBC: 10.1 10*3/uL (ref 4.0–10.5)
nRBC: 0 % (ref 0.0–0.2)

## 2020-04-26 LAB — LIPASE, BLOOD: Lipase: 30 U/L (ref 11–51)

## 2020-04-26 MED ORDER — HYDROMORPHONE HCL 1 MG/ML IJ SOLN
1.0000 mg | Freq: Once | INTRAMUSCULAR | Status: AC
Start: 2020-04-26 — End: 2020-04-26
  Administered 2020-04-26: 1 mg via INTRAVENOUS
  Filled 2020-04-26: qty 1

## 2020-04-26 MED ORDER — HYDROMORPHONE HCL 2 MG/ML IJ SOLN: 2.0000 mg | Freq: Once | INTRAMUSCULAR | Status: DC

## 2020-04-26 MED ORDER — SUCRALFATE 1 G PO TABS: 1.0000 g | ORAL_TABLET | Freq: Four times a day (QID) | ORAL | 0 refills | Status: DC

## 2020-04-26 MED ORDER — IOHEXOL 300 MG/ML  SOLN
100.0000 mL | Freq: Once | INTRAMUSCULAR | Status: AC | PRN
  Administered 2020-04-26: 100 mL via INTRAVENOUS

## 2020-04-26 MED ORDER — METOCLOPRAMIDE HCL 5 MG/ML IJ SOLN
5.0000 mg | Freq: Once | INTRAMUSCULAR | Status: AC
  Administered 2020-04-26: 5 mg via INTRAVENOUS
  Filled 2020-04-26: qty 2

## 2020-04-26 MED ORDER — FAMOTIDINE 20 MG PO TABS
40.0000 mg | ORAL_TABLET | Freq: Once | ORAL | Status: AC
  Administered 2020-04-26: 40 mg via ORAL
  Filled 2020-04-26: qty 2

## 2020-04-26 MED ORDER — OXYCODONE-ACETAMINOPHEN 5-325 MG PO TABS: 1.0000 | ORAL_TABLET | Freq: Four times a day (QID) | ORAL | 0 refills | Status: DC | PRN

## 2020-04-26 MED ORDER — HYDROMORPHONE HCL 1 MG/ML IJ SOLN
1.0000 mg | Freq: Once | INTRAMUSCULAR | Status: AC
  Administered 2020-04-26: 1 mg via INTRAVENOUS
  Filled 2020-04-26: qty 1

## 2020-04-26 NOTE — ED Provider Notes (Signed)
[facility]-EMERGENCY DEPT Provider Note   CSN: [id number] Arrival date & time: 04/26/20  1252     History Chief Complaint  Patient presents with  . Abdominal Pain    LORELIE Watson is a 45 y.o. female.  45 year old female presents with 1 week of persistent upper quadrant pain that radiates to her flank.  Denies any urinary symptoms.  No fever or chills.  Nausea but no vomiting.  Has used ibuprofen without relief.  No vaginal bleeding or discharge.  No prior history of same.  No association with food.          History reviewed. No pertinent past medical history.  Patient Active Problem List   Diagnosis Date Noted  . ABDOMINAL PAIN, UNSPECIFIED SITE 05/16/2008    Past Surgical History:  Procedure Laterality Date  . ABDOMINAL HYSTERECTOMY    . CESAREAN SECTION    . CHOLECYSTECTOMY    . MANDIBLE FRACTURE SURGERY       OB History    Gravida  4   Para  3   Term  3   Preterm  0   AB  1   Living  3     SAB  1   IAB  0   Ectopic  0   Multiple  0   Live Births              Family History  Problem Relation Age of Onset  . Hypertension Mother   . Hypertension Maternal Grandmother   . Cancer Maternal Grandmother   . Cancer Maternal Grandfather   . Hypertension Paternal Grandmother   . Diabetes Paternal Grandmother     Social History   Tobacco Use  . Smoking status: Current Every Day Smoker    Packs/day: 0.50    Years: 15.00    Pack years: 7.50    Types: Cigarettes  Substance Use Topics  . Alcohol use: Yes  . Drug use: No    Home Medications Prior to Admission medications   Medication Sig Start Date End Date Taking? Authorizing Provider  cyclobenzaprine (FLEXERIL) 5 MG tablet Take 5 mg by mouth 3 (three) times daily as needed for muscle spasms.    [provider]  HYDROcodone-acetaminophen (VICODIN) 5-500 MG per tablet Take 1-2 tablets by mouth every 6 (six) hours as needed. As needed for pain.     [provider]  ibuprofen (ADVIL,MOTRIN) 200 MG tablet Take 600-800 mg by mouth every 6 (six) hours as needed. For pain    [provider]  metoCLOPramide (REGLAN) 10 MG tablet Take 1 tablet (10 mg total) by mouth every 6 (six) hours. 03/21/11 03/31/11  Elpidio Anis, PA-C  Multiple Vitamins-Minerals (MULTIVITAMINS THER. W/MINERALS) TABS Take 1 tablet by mouth daily.    [provider]    Allergies    Patient has no known allergies.  Review of Systems   Review of Systems  All other systems reviewed and are negative.   Physical Exam Updated Vital Signs BP (!) 186/112 (BP Location: Right Arm)   Pulse 99   Temp 98.3 F (36.8 C) (Oral)   Resp 18   SpO2 100%   Physical Exam Vitals and nursing note reviewed.  Constitutional:      General: She is not in acute distress.    Appearance: Normal appearance. She is well-developed. She is not toxic-appearing.  HENT:     Head: Normocephalic and atraumatic.  Eyes:     General: Lids are normal.  Conjunctiva/sclera: Conjunctivae normal.     Pupils: Pupils are equal, round, and reactive to light.  Neck:     Thyroid: No thyroid mass.     Trachea: No tracheal deviation.  Cardiovascular:     Rate and Rhythm: Normal rate and regular rhythm.     Heart sounds: Normal heart sounds. No murmur heard. No gallop.   Pulmonary:     Effort: Pulmonary effort is normal. No respiratory distress.     Breath sounds: Normal breath sounds. No stridor. No decreased breath sounds, wheezing, rhonchi or rales.  Abdominal:     General: Bowel sounds are normal. There is no distension.     Palpations: Abdomen is soft.     Tenderness: There is abdominal tenderness in the left upper quadrant. There is guarding. There is no rebound.    Musculoskeletal:        General: No tenderness. Normal range of motion.     Cervical back: Normal range of motion and neck supple.  Skin:    General: Skin is warm and dry.     Findings: No abrasion or  rash.  Neurological:     Mental Status: She is alert and oriented to person, place, and time.     GCS: GCS eye subscore is 4. GCS verbal subscore is 5. GCS motor subscore is 6.     Cranial Nerves: No cranial nerve deficit.     Sensory: No sensory deficit.  Psychiatric:        Speech: Speech normal.        Behavior: Behavior normal.     ED Results / Procedures / Treatments   Labs (all labs ordered are listed, but only abnormal results are displayed) Labs Reviewed  CBC - Abnormal; Notable for the following components:      Result Value   Hemoglobin 15.3 (*)    All other components within normal limits  LIPASE, BLOOD  COMPREHENSIVE METABOLIC PANEL  URINALYSIS, ROUTINE W REFLEX MICROSCOPIC    EKG None  Radiology No results found.  Procedures Procedures   Medications Ordered in ED Medications  metoCLOPramide (REGLAN) injection 5 mg (has no administration in time range)  HYDROmorphone (DILAUDID) injection 1 mg (has no administration in time range)    ED Course  I have reviewed the triage vital signs and the nursing notes.  Pertinent labs & imaging results that were available during my care of the patient were reviewed by me and considered in my medical decision making (see chart for details).    MDM Rules/Calculators/A&P                         Patient with negative CT of the abdomen here.  Labs are reassuring.  Suspect some type of GI etiology.  Will prescribe Carafate and Pepcid.  Final Clinical Impression(s) / ED Diagnoses Final diagnoses:  None    Rx / DC Orders ED Discharge Orders    None       Angel Nick, MD 04/26/20 1642

## 2020-04-26 NOTE — ED Triage Notes (Addendum)
Patient c/o LUQ pain radiating to back x1 week with pain worsening over the last three days. Denies N/V/D.

## 2020-04-30 ENCOUNTER — Other Ambulatory Visit: Payer: Self-pay

## 2020-04-30 ENCOUNTER — Encounter (HOSPITAL_COMMUNITY): Payer: Self-pay | Admitting: Emergency Medicine

## 2020-04-30 ENCOUNTER — Emergency Department (HOSPITAL_COMMUNITY)
Admission: EM | Admit: 2020-04-30 | Discharge: 2020-04-30 | Disposition: A | Discharge status: Home / Self Care | Payer: Self-pay | Attending: Emergency Medicine | Admitting: Emergency Medicine

## 2020-04-30 DIAGNOSIS — R109 Unspecified abdominal pain: Secondary | ICD-10-CM | POA: Insufficient documentation

## 2020-04-30 DIAGNOSIS — F17210 Nicotine dependence, cigarettes, uncomplicated: Secondary | ICD-10-CM | POA: Insufficient documentation

## 2020-04-30 DIAGNOSIS — B029 Zoster without complications: Secondary | ICD-10-CM | POA: Insufficient documentation

## 2020-04-30 DIAGNOSIS — M549 Dorsalgia, unspecified: Secondary | ICD-10-CM | POA: Insufficient documentation

## 2020-04-30 DIAGNOSIS — R42 Dizziness and giddiness: Secondary | ICD-10-CM | POA: Insufficient documentation

## 2020-04-30 DIAGNOSIS — Z5321 Procedure and treatment not carried out due to patient leaving prior to being seen by health care provider: Secondary | ICD-10-CM | POA: Insufficient documentation

## 2020-04-30 LAB — I-STAT BETA HCG BLOOD, ED (MC, WL, AP ONLY): I-stat hCG, quantitative: <5 m[IU]/mL (ref <5)

## 2020-04-30 LAB — URINALYSIS, ROUTINE W REFLEX MICROSCOPIC
Bacteria, UA: NONE SEEN
Bilirubin Urine: NEGATIVE
Glucose, UA: NEGATIVE mg/dL
Ketones, ur: 5 mg/dL — AB
Leukocytes,Ua: NEGATIVE
Nitrite: NEGATIVE
Protein, ur: NEGATIVE mg/dL
Specific Gravity, Urine: 1.02 (ref 1.005–1.030)
pH: 6 (ref 5.0–8.0)

## 2020-04-30 LAB — COMPREHENSIVE METABOLIC PANEL
ALT: 65 U/L — ABNORMAL HIGH (ref 0–44)
AST: 63 U/L — ABNORMAL HIGH (ref 15–41)
Albumin: 3.8 g/dL (ref 3.5–5.0)
Alkaline Phosphatase: 51 U/L (ref 38–126)
Anion gap: 7 (ref 5–15)
BUN: 11 mg/dL (ref 6–20)
CO2: 25 mmol/L (ref 22–32)
Calcium: 8.6 mg/dL — ABNORMAL LOW (ref 8.9–10.3)
Chloride: 104 mmol/L (ref 98–111)
Creatinine, Ser: 0.75 mg/dL (ref 0.44–1.00)
GFR, Estimated: >60 mL/min (ref >60)
Glucose, Bld: 100 mg/dL — ABNORMAL HIGH (ref 70–99)
Potassium: 3.9 mmol/L (ref 3.5–5.1)
Sodium: 136 mmol/L (ref 135–145)
Total Bilirubin: 0.8 mg/dL (ref 0.3–1.2)
Total Protein: 7.1 g/dL (ref 6.5–8.1)

## 2020-04-30 LAB — CBC
HCT: 43.1 % (ref 36.0–46.0)
Hemoglobin: 14.5 g/dL (ref 12.0–15.0)
MCH: 31.5 pg (ref 26.0–34.0)
MCHC: 33.6 g/dL (ref 30.0–36.0)
MCV: 93.5 fL (ref 80.0–100.0)
Platelets: 352 10*3/uL (ref 150–400)
RBC: 4.61 MIL/uL (ref 3.87–5.11)
RDW: 12.7 % (ref 11.5–15.5)
WBC: 7.9 10*3/uL (ref 4.0–10.5)
nRBC: 0 % (ref 0.0–0.2)

## 2020-04-30 LAB — LIPASE, BLOOD: Lipase: 34 U/L (ref 11–51)

## 2020-04-30 MED ORDER — OXYCODONE-ACETAMINOPHEN 5-325 MG PO TABS
1.0000 | ORAL_TABLET | Freq: Once | ORAL | Status: AC
  Administered 2020-04-30: 1 via ORAL
  Filled 2020-04-30: qty 1

## 2020-04-30 MED ORDER — PREDNISONE 10 MG (21) PO TBPK: ORAL_TABLET | Freq: Every day | ORAL | 0 refills | Status: DC

## 2020-04-30 MED ORDER — VALACYCLOVIR HCL 1 G PO TABS: 1000.0000 mg | ORAL_TABLET | Freq: Three times a day (TID) | ORAL | 0 refills | Status: AC

## 2020-04-30 MED ORDER — VALACYCLOVIR HCL 1 G PO TABS: 1000.0000 mg | ORAL_TABLET | Freq: Three times a day (TID) | ORAL | 0 refills | Status: DC

## 2020-04-30 NOTE — ED Provider Notes (Signed)
[facility]-EMERGENCY DEPT Provider Note   CSN: [id number] Arrival date & time: 04/30/20  1631     History Chief Complaint  Patient presents with  . Abdominal Pain    [name] is a 45 y.o. female presents to the ER for evaluation of continued left flank and left upper quadrant abdominal pain for the last 1.5 weeks.  Pain was sudden in onset, gradually worsening now constant, severe, throbbing, burning . She came to the ER on 04/26/20. She has labs and CTAP and was told everything was normal. Was discharged with anti acid medicines and hydrocodone. States hydrocodone gives her relief of pain for 3 hours and that's it. Was instructed to go to GI but when she called they told her she had to wait 2 months to be seen in the office. She has recently lost her insurance and PCP. She is teary eyed because of the severity of the pain and being unable to see GI soon.  Returns to the ED because of the pain. States she went into work and had to leave due to the pain.  Denies fevers, nausea, vomiting, diarrhea. Denies dysuria, hematuria, urinary frequency or urgency.  No history of kidney stones.  No trauma or falls.  States that 2 days ago she noticed a new rash developing on her left flank.  States now this area of her skin is very sensitive to touch, feels like she cannot even put her shirt over this area because it is really uncomfortable.  She had chickenpox as a child.  HPI     History reviewed. No pertinent past medical history.  Patient Active Problem List   Diagnosis Date Noted  . ABDOMINAL PAIN, UNSPECIFIED SITE 05/16/2008    Past Surgical History:  Procedure Laterality Date  . ABDOMINAL HYSTERECTOMY    . CESAREAN SECTION    . CHOLECYSTECTOMY    . MANDIBLE FRACTURE SURGERY       OB History    Gravida  4   Para  3   Term  3   Preterm  0   AB  1   Living  3     SAB  1   IAB  0   Ectopic  0   Multiple  0   Live Births               Family History  Problem Relation Age of Onset  . Hypertension Mother   . Hypertension Maternal Grandmother   . Cancer Maternal Grandmother   . Cancer Maternal Grandfather   . Hypertension Paternal Grandmother   . Diabetes Paternal Grandmother     Social History   Tobacco Use  . Smoking status: Current Every Day Smoker    Packs/day: 0.50    Years: 15.00    Pack years: 7.50    Types: Cigarettes  Substance Use Topics  . Alcohol use: Yes  . Drug use: No    Home Medications Prior to Admission medications   Medication Sig Start Date End Date Taking? Authorizing Provider  ibuprofen (ADVIL,MOTRIN) 200 MG tablet Take 600-800 mg by mouth every 6 (six) hours as needed for mild pain. For pain   Yes [provider]  oxyCODONE-acetaminophen (PERCOCET/ROXICET) 5-325 MG tablet Take 1 tablet by mouth every 6 (six) hours as needed for severe pain. 04/26/20  Yes Lorre Nick, MD  sucralfate (CARAFATE) 1 g tablet Take 1 tablet (1 g total) by mouth 4 (four) times daily. 04/26/20  Yes Lorre Nick,  MD  metoCLOPramide (REGLAN) 10 MG tablet Take 1 tablet (10 mg total) by mouth every 6 (six) hours. 03/21/11 03/31/11  Elpidio Anis, PA-C  predniSONE (STERAPRED UNI-PAK 21 TAB) 10 MG (21) TBPK tablet Take by mouth daily. Take 6 tabs by mouth daily  for 2 days, then 5 tabs for 2 days, then 4 tabs for 2 days, then 3 tabs for 2 days, 2 tabs for 2 days, then 1 tab by mouth daily for 2 days 04/30/20   Liberty Handy, PA-C  valACYclovir (VALTREX) 1000 MG tablet Take 1 tablet (1,000 mg total) by mouth 3 (three) times daily for 14 days. 04/30/20 05/14/20  Liberty Handy, PA-C    Allergies    Patient has no known allergies.  Review of Systems   Review of Systems  Gastrointestinal: Positive for abdominal pain.  Genitourinary: Positive for flank pain.  Skin: Positive for rash.  All other systems reviewed and are negative.   Physical Exam Updated Vital Signs BP (!) 153/102   Pulse 85    Temp 98.4 F (36.9 C) (Oral)   Resp 13   SpO2 98%   Physical Exam Vitals and nursing note reviewed.  Constitutional:      Appearance: She is well-developed.     Comments: Non toxic in NAD  HENT:     Head: Normocephalic and atraumatic.     Nose: Nose normal.  Eyes:     Conjunctiva/sclera: Conjunctivae normal.  Cardiovascular:     Rate and Rhythm: Normal rate and regular rhythm.  Pulmonary:     Effort: Pulmonary effort is normal.     Breath sounds: Normal breath sounds.  Abdominal:     General: Bowel sounds are normal.     Palpations: Abdomen is soft.     Tenderness: There is abdominal tenderness (diffuse left flank, left lateral abdominal and LUE tenderness to light touch/graze of skin).  Musculoskeletal:        General: Normal range of motion.     Cervical back: Normal range of motion.  Skin:    General: Skin is warm and dry.     Capillary Refill: Capillary refill takes less than 2 seconds.     Findings: Rash (clutered vesicular erythematous lesions on left flank ) present.  Neurological:     Mental Status: She is alert.  Psychiatric:        Behavior: Behavior normal.          ED Results / Procedures / Treatments   Labs (all labs ordered are listed, but only abnormal results are displayed) Labs Reviewed  URINALYSIS, ROUTINE W REFLEX MICROSCOPIC - Abnormal; Notable for the following components:      Result Value   Hgb urine dipstick SMALL (*)    Ketones, ur 5 (*)    All other components within normal limits    EKG None  Radiology No results found.  Procedures Procedures   Medications Ordered in ED Medications  oxyCODONE-acetaminophen (PERCOCET/ROXICET) 5-325 MG per tablet 1 tablet (1 tablet Oral Given 04/30/20 2000)    ED Course  I have reviewed the triage vital signs and the nursing notes.  Pertinent labs & imaging results that were available during my care of the patient were reviewed by me and considered in my medical decision making (see chart  for details).    MDM Rules/Calculators/A&P                          45 yo  F presents to ER for continued left flank pain radiating to left lateral abdomen and LUQ. Developed vesicular rash 2 days ago.  Seen in the ER 04/30/20 and had labs, CTAP. These were personally reviewed. Labs were unremarkable. UA sample at that time had several sq epi cells but not obviously infected.  Repeat labs and urinalysis today essentially unremarkable.  Rash is most consistent with shingles rash with prodromal dermatomal pain.  Considered other causes like pyelonephritis, ureteral stone, AAA, dissection, PE unlikely. Will discharge with antiviral medicines, prednisone, high dose NSAIDs. Will defer refill of hydrocodone and refer to PCP.  Discussed return precautions.  Briefly discussed PHN and reasons to seek re-evaluation by PCP if chronic pain lingers after rash clears. Discussed with EDP Wentz. Appropriate for discharge.   Final Clinical Impression(s) / ED Diagnoses Final diagnoses:  Herpes zoster without complication    Rx / DC Orders ED Discharge Orders         Ordered    valACYclovir (VALTREX) 1000 MG tablet  3 times daily,   Status:  Discontinued        04/30/20 1931    predniSONE (STERAPRED UNI-PAK 21 TAB) 10 MG (21) TBPK tablet  Daily,   Status:  Discontinued        04/30/20 1931    valACYclovir (VALTREX) 1000 MG tablet  3 times daily        04/30/20 1954    predniSONE (STERAPRED UNI-PAK 21 TAB) 10 MG (21) TBPK tablet  Daily        04/30/20 1954           Jerrell Mylar 04/30/20 2158    Mancel Bale, MD 05/01/20 1039

## 2020-04-30 NOTE — ED Triage Notes (Signed)
Emergency Medicine Provider Triage Evaluation Note  [name] , a 45 y.o. female  was evaluated in triage.  Pt complains of LUQ pain.  Review of Systems  Positive: LUQ pain,  Back pain  Negative: nausea, vomitting, fevers, dysuria, vaginal bleeding   Physical Exam  BP (!) 185/109 (BP Location: Right Arm)   Pulse (!) 101   Temp 98.8 F (37.1 C) (Oral)   Resp 20   SpO2 100%  Gen:   Awake, no distress   HEENT:  Atraumatic  Resp:  Normal effort Cardiac:  Normal rate  Abd:   Nondistended, tender in LUQ  MSK:   Moves extremities without difficulty  Neuro:  Speech clear  Medical Decision Making  Medically screening exam initiated at 2:55 PM.  Appropriate orders placed.  [name] was informed that the remainder of the evaluation will be completed by another provider, this initial triage assessment does not replace that evaluation, and the importance of remaining in the ED until their evaluation is complete.  Clinical Impression     Angel Ferrari, PA-C 04/30/20 1736

## 2020-04-30 NOTE — Discharge Instructions (Addendum)
You were seen in the ER for continued pain in the left flank/abdomen area  Recent lab work and CT 4 days ago was reviewed, and overall normal   Urinalysis done today did not show infection  Your new rash today is most consistent with shingles rash.  Typically shingles can cause a prodrome of severe pain along the area where the nerve is involved with subsequent development of shingles rash  Shingle rash is painful, itchy burning due to the chickenpox virus  Shingles rash is treated with anti-viral medicines and steroids. Take valacyclovir and prednisone as prescribed. These medicines should help with some of the pain over the next 48-72  hr   Shingles rash puts you at risk of developing post herpatic neuralgia. This a pain condition that affects nerve and skin in the area of shingles rash that can cause burning pain that lasts long after the rash and blisters disappear.  You may or may not get post herpatic neuralgia.    For more pain control you can take high doses of ibuprofen and acetaminophen.    Unfortunately we cannot refill or prescribe a second round of opioid strength pain medications in the ER. Please speak to your primary care doctor if pain continues    For pain and inflammation you can use a combination of ibuprofen and acetaminophen.  Take [phone] mg acetaminophen (tylenol) every 6 hours or 600 mg ibuprofen (advil, motrin) every 6 hours.  You can take these separately or combine them every 6 hours for maximum pain control. Do not exceed 4,000 mg acetaminophen or 2,400 mg ibuprofen in a 24 hour period.  Do not take ibuprofen containing products if you have history of kidney disease, ulcers, GI bleeding, severe acid reflux, or take a blood thinner.  Do not take acetaminophen if you have liver disease.

## 2020-04-30 NOTE — ED Triage Notes (Signed)
Pt arrived c/o abdominal pain that started about 3wks ago and was seen at [facility] last week for same complaint and told to see a GI provider and pt has not been able to get any appointments.  Pain is on left side and radiates to her back denies N/V/D and feeling lightheaded.

## 2020-04-30 NOTE — ED Triage Notes (Signed)
Pt reports LUQ abdominal pain for about 3 weeks. She was seen last week for same, but GI will not give her an appointment without a referral. Patient tearful in triage. Pain radiates to her back. Denies N/V/D.

## 2020-05-19 ENCOUNTER — Emergency Department (HOSPITAL_COMMUNITY)
Admission: EM | Admit: 2020-05-19 | Discharge: 2020-05-19 | Disposition: A | Discharge status: Home / Self Care | Payer: Self-pay | Attending: Emergency Medicine | Admitting: Emergency Medicine

## 2020-05-19 ENCOUNTER — Emergency Department (HOSPITAL_COMMUNITY): Payer: Self-pay

## 2020-05-19 ENCOUNTER — Other Ambulatory Visit: Payer: Self-pay

## 2020-05-19 DIAGNOSIS — F17210 Nicotine dependence, cigarettes, uncomplicated: Secondary | ICD-10-CM | POA: Insufficient documentation

## 2020-05-19 DIAGNOSIS — K2900 Acute gastritis without bleeding: Secondary | ICD-10-CM | POA: Insufficient documentation

## 2020-05-19 LAB — COMPREHENSIVE METABOLIC PANEL
ALT: 16 U/L (ref 0–44)
AST: 18 U/L (ref 15–41)
Albumin: 3.8 g/dL (ref 3.5–5.0)
Alkaline Phosphatase: 41 U/L (ref 38–126)
Anion gap: 9 (ref 5–15)
BUN: 13 mg/dL (ref 6–20)
CO2: 22 mmol/L (ref 22–32)
Calcium: 8.8 mg/dL — ABNORMAL LOW (ref 8.9–10.3)
Chloride: 107 mmol/L (ref 98–111)
Creatinine, Ser: 0.69 mg/dL (ref 0.44–1.00)
GFR, Estimated: >60 mL/min (ref >60)
Glucose, Bld: 111 mg/dL — ABNORMAL HIGH (ref 70–99)
Potassium: 3.8 mmol/L (ref 3.5–5.1)
Sodium: 138 mmol/L (ref 135–145)
Total Bilirubin: 0.3 mg/dL (ref 0.3–1.2)
Total Protein: 6.9 g/dL (ref 6.5–8.1)

## 2020-05-19 LAB — CBC
HCT: 44.8 % (ref 36.0–46.0)
Hemoglobin: 15 g/dL (ref 12.0–15.0)
MCH: 31.6 pg (ref 26.0–34.0)
MCHC: 33.5 g/dL (ref 30.0–36.0)
MCV: 94.3 fL (ref 80.0–100.0)
Platelets: 318 10*3/uL (ref 150–400)
RBC: 4.75 MIL/uL (ref 3.87–5.11)
RDW: 12.8 % (ref 11.5–15.5)
WBC: 10.8 10*3/uL — ABNORMAL HIGH (ref 4.0–10.5)
nRBC: 0 % (ref 0.0–0.2)

## 2020-05-19 LAB — I-STAT BETA HCG BLOOD, ED (MC, WL, AP ONLY): I-stat hCG, quantitative: <5 m[IU]/mL (ref <5)

## 2020-05-19 LAB — LIPASE, BLOOD: Lipase: 31 U/L (ref 11–51)

## 2020-05-19 MED ORDER — ONDANSETRON HCL 4 MG/2ML IJ SOLN
4.0000 mg | Freq: Once | INTRAMUSCULAR | Status: AC
  Administered 2020-05-19: 4 mg via INTRAVENOUS
  Filled 2020-05-19: qty 2

## 2020-05-19 MED ORDER — FENTANYL CITRATE (PF) 100 MCG/2ML IJ SOLN
50.0000 ug | Freq: Once | INTRAMUSCULAR | Status: AC
  Administered 2020-05-19: 50 ug via INTRAVENOUS
  Filled 2020-05-19: qty 2

## 2020-05-19 MED ORDER — ALUM & MAG HYDROXIDE-SIMETH 200-200-20 MG/5ML PO SUSP
15.0000 mL | Freq: Once | ORAL | Status: AC
  Administered 2020-05-19: 15 mL via ORAL
  Filled 2020-05-19: qty 30

## 2020-05-19 MED ORDER — IOHEXOL 300 MG/ML  SOLN
100.0000 mL | Freq: Once | INTRAMUSCULAR | Status: AC | PRN
  Administered 2020-05-19: 100 mL via INTRAVENOUS

## 2020-05-19 MED ORDER — PANTOPRAZOLE SODIUM 20 MG PO TBEC: 20.0000 mg | DELAYED_RELEASE_TABLET | Freq: Two times a day (BID) | ORAL | 0 refills | Status: DC

## 2020-05-19 NOTE — ED Provider Notes (Signed)
[facility]-EMERGENCY DEPT Provider Note   CSN: [id number] Arrival date & time: 05/19/20  3419     History Chief Complaint  Patient presents with  . Abdominal Pain    [name] is a 45 y.o. female.  [name] presents for her third ED visit for left upper quadrant pain.  Part of the reason for her repeat presentation is lack of insurance coverage and inability to seek outpatient follow-up due to lack of insurance. She is between jobs.  She does think she is going to have insurance within the next few weeks.  The patient had a CT scan about 1 month ago.  2 weeks ago, she was diagnosed with shingles of the left mid back.  She took her medication, and that issue is better.  However, her left upper quadrant pain has persisted ever since.  The history is provided by the patient.  Abdominal Pain Pain location:  LUQ Pain quality: sharp and stabbing   Pain radiates to:  Does not radiate Pain severity:  Severe Onset quality:  Sudden Duration:  4 weeks Timing:  Constant Progression:  Waxing and waning Chronicity:  New Context: awakening from sleep   Context: not recent illness, not sick contacts and not trauma   Relieved by:  Nothing Exacerbated by: sitting forward, standing. Ineffective treatments:  NSAIDs (has been taking ibuprofen daily) Associated symptoms: no anorexia, no chest pain, no chills, no constipation, no cough, no diarrhea, no dysuria, no fever, no hematemesis, no hematochezia, no hematuria, no nausea, no shortness of breath, no sore throat, no vaginal discharge and no vomiting   Risk factors comment:  History of cholecystectomy, hysterectomy      No past medical history on file.  Patient Active Problem List   Diagnosis Date Noted  . ABDOMINAL PAIN, UNSPECIFIED SITE 05/16/2008    Past Surgical History:  Procedure Laterality Date  . ABDOMINAL HYSTERECTOMY    . CESAREAN SECTION    . CHOLECYSTECTOMY    . MANDIBLE FRACTURE  SURGERY       OB History    Gravida  4   Para  3   Term  3   Preterm  0   AB  1   Living  3     SAB  1   IAB  0   Ectopic  0   Multiple  0   Live Births              Family History  Problem Relation Age of Onset  . Hypertension Mother   . Hypertension Maternal Grandmother   . Cancer Maternal Grandmother   . Cancer Maternal Grandfather   . Hypertension Paternal Grandmother   . Diabetes Paternal Grandmother     Social History   Tobacco Use  . Smoking status: Current Every Day Smoker    Packs/day: 0.50    Years: 15.00    Pack years: 7.50    Types: Cigarettes  Substance Use Topics  . Alcohol use: Yes  . Drug use: No    Home Medications Prior to Admission medications   Medication Sig Start Date End Date Taking? Authorizing Provider  ibuprofen (ADVIL,MOTRIN) 200 MG tablet Take 600-800 mg by mouth every 6 (six) hours as needed for mild pain. For pain    [provider]  metoCLOPramide (REGLAN) 10 MG tablet Take 1 tablet (10 mg total) by mouth every 6 (six) hours. 03/21/11 03/31/11  Elpidio Anis, PA-C  oxyCODONE-acetaminophen (PERCOCET/ROXICET) 5-325 MG tablet Take  1 tablet by mouth every 6 (six) hours as needed for severe pain. 04/26/20   Lorre Nick, MD  predniSONE (STERAPRED UNI-PAK 21 TAB) 10 MG (21) TBPK tablet Take by mouth daily. Take 6 tabs by mouth daily  for 2 days, then 5 tabs for 2 days, then 4 tabs for 2 days, then 3 tabs for 2 days, 2 tabs for 2 days, then 1 tab by mouth daily for 2 days 04/30/20   Liberty Handy, PA-C  sucralfate (CARAFATE) 1 g tablet Take 1 tablet (1 g total) by mouth 4 (four) times daily. 04/26/20   Lorre Nick, MD    Allergies    Patient has no known allergies.  Review of Systems   Review of Systems  Constitutional: Negative for chills and fever.  HENT: Negative for ear pain and sore throat.   Eyes: Negative for pain and visual disturbance.  Respiratory: Negative for cough and shortness of breath.    Cardiovascular: Negative for chest pain and palpitations.  Gastrointestinal: Positive for abdominal pain. Negative for anorexia, constipation, diarrhea, hematemesis, hematochezia, nausea and vomiting.  Genitourinary: Negative for dysuria, hematuria and vaginal discharge.  Musculoskeletal: Negative for arthralgias and back pain.  Skin: Negative for color change and rash.  Neurological: Negative for seizures and syncope.  All other systems reviewed and are negative.   Physical Exam Updated Vital Signs BP (!) 161/103 (BP Location: Right Arm)   Pulse 81   Temp 97.9 F (36.6 C) (Oral)   Resp 18   Ht 5\' 8"  (1.727 m)   Wt 104.3 kg   SpO2 99%   BMI 34.97 kg/m   Physical Exam Vitals and nursing note reviewed.  Constitutional:      General: She is not in acute distress.    Appearance: She is well-developed.  HENT:     Head: Normocephalic and atraumatic.  Eyes:     Conjunctiva/sclera: Conjunctivae normal.  Cardiovascular:     Rate and Rhythm: Normal rate and regular rhythm.     Heart sounds: No murmur heard.   Pulmonary:     Effort: Pulmonary effort is normal. No respiratory distress.     Breath sounds: Normal breath sounds.  Abdominal:     Palpations: Abdomen is soft.     Tenderness: There is abdominal tenderness in the left upper quadrant.  Musculoskeletal:     Cervical back: Neck supple.  Skin:    General: Skin is warm and dry.     Comments: Improving shingles rash at left midback  Neurological:     General: No focal deficit present.     Mental Status: She is alert.  Psychiatric:        Mood and Affect: Mood normal.     ED Results / Procedures / Treatments   Labs (all labs ordered are listed, but only abnormal results are displayed) Labs Reviewed  COMPREHENSIVE METABOLIC PANEL - Abnormal; Notable for the following components:      Result Value   Glucose, Bld 111 (*)    Calcium 8.8 (*)    All other components within normal limits  CBC - Abnormal; Notable for  the following components:   WBC 10.8 (*)    All other components within normal limits  LIPASE, BLOOD  I-STAT BETA HCG BLOOD, ED ([facility], [facility], [facility] ONLY)    EKG None  Radiology DG Chest 2 View  Result Date: 05/19/2020 CLINICAL DATA:  Left upper quadrant abdominal pain for 1 month. EXAM: CHEST - 2 VIEW COMPARISON:  None. FINDINGS: Normal heart, mediastinum and hila. Clear lungs.  No pleural effusion or pneumothorax. Skeletal structures are unremarkable. IMPRESSION: Normal chest radiographs. Electronically Signed   By: Amie Portland M.D.   On: 05/19/2020 08:16   CT Abdomen Pelvis W Contrast  Result Date: 05/19/2020 CLINICAL DATA:  Diverticulitis suspected, 1 month of progressive left upper quadrant pain EXAM: CT ABDOMEN AND PELVIS WITH CONTRAST TECHNIQUE: Multidetector CT imaging of the abdomen and pelvis was performed using the standard protocol following bolus administration of intravenous contrast. CONTRAST:  OMNIPAQUE IOHEXOL 300 MG/ML  SOLN COMPARISON:  CT abdomen pelvis 04/26/2020 FINDINGS: Lower chest: Bibasilar hypoventilatory changes. Unchanged cardiac size. Hepatobiliary: No focal liver abnormality is seen. Prior cholecystectomy. Pancreas: Unremarkable. No pancreatic ductal dilatation or surrounding inflammatory changes. Spleen: Normal in size without focal abnormality. Adrenals/Urinary Tract: Adrenal glands are unremarkable. No hydronephrosis. There is an unchanged subcentimeter nodes in partially exophytic right upper pole renal cyst and unchanged tiny interpolar cyst in the left kidney. Bladder is unremarkable. Stomach/Bowel: Stomach is within normal limits. Appendix appears normal. No evidence of bowel wall thickening, distention, or inflammatory changes. Vascular/Lymphatic: No significant vascular findings are present. No enlarged abdominal or pelvic lymph nodes. Reproductive: Prior hysterectomy and possibly unilateral oophorectomy. Other: No abdominal wall hernia or abnormality. No  abdominopelvic ascites. Musculoskeletal: No acute or significant osseous findings. No suspicious lytic or blastic lesions. IMPRESSION: No acute abdominopelvic abnormality. No findings to explain left upper quadrant pain. Electronically Signed   By: Caprice Renshaw   On: 05/19/2020 09:34    Procedures Procedures   Medications Ordered in ED Medications  alum & mag hydroxide-simeth (MAALOX/MYLANTA) 200-200-20 MG/5ML suspension 15 mL (15 mLs Oral Given 05/19/20 0626)    ED Course  I have reviewed the triage vital signs and the nursing notes.  Pertinent labs & imaging results that were available during my care of the patient were reviewed by me and considered in my medical decision making (see chart for details).    MDM Rules/Calculators/A&P                          Alania Overholt Tosi is well-appearing.  She presents with 1 month of left upper quadrant abdominal pain.  ED work-up was reassuring, and repeat CT scan was normal.  I considered whether or not her symptoms were related to a pulmonary abnormality, but she has no symptoms related to a cardiopulmonary source.  Chest x-ray was also reassuring.  I would like her to start a proton pump inhibitor, follow-up with PCP, and likely see GI for consideration of endoscopy.  I think she is suitable for outpatient follow-up. Final Clinical Impression(s) / ED Diagnoses Final diagnoses:  Acute gastritis without hemorrhage, unspecified gastritis type    Rx / DC Orders ED Discharge Orders         Ordered    pantoprazole (PROTONIX) 20 MG tablet  2 times daily        05/19/20 0947           Koleen Distance, MD 05/19/20 [phone]

## 2020-05-19 NOTE — ED Notes (Signed)
Patient appears restless and moving constantly in bed- requesting additional pain medication.  Dr Delford Field made aware

## 2020-05-19 NOTE — ED Provider Notes (Signed)
MSE was initiated and I personally evaluated the patient and placed orders (if any) at  5:50 AM on May 19, 2020.  She reports one month of progressive LUQ pain.  Has sweats.  Pain is worse with lying supine.  Has mild nausea.  No fever, vomiting, diarrhea.  No chest pain or SOB.    The patient appears stable so that the remainder of the MSE may be completed by another provider.   Tilden Fossa, MD 05/19/20 [phone]

## 2020-05-19 NOTE — ED Triage Notes (Signed)
Pt c/o LUQ abd pain x 63mo, seen multiple times for same but states it has not gotten any better. Has tried to follow up with GI but having issues with getting appointment.

## 2020-05-19 NOTE — ED Notes (Signed)
Patient to CT.

## 2020-05-19 NOTE — ED Notes (Signed)
Ambulates to the BR

## 2020-05-19 NOTE — ED Notes (Signed)
Discharge instructions given to the patient and reviewed-

## 2021-11-18 ENCOUNTER — Inpatient Hospital Stay (HOSPITAL_COMMUNITY)
Admission: EM | Admit: 2021-11-18 | Discharge: 2021-11-23 | DRG: 690 | Disposition: A | Discharge status: Home / Self Care | Payer: BC Managed Care – PPO | Attending: Internal Medicine | Admitting: Internal Medicine

## 2021-11-18 ENCOUNTER — Emergency Department (HOSPITAL_COMMUNITY): Payer: BC Managed Care – PPO

## 2021-11-18 ENCOUNTER — Ambulatory Visit
Admission: RE | Admit: 2021-11-18 | Discharge: 2021-11-18 | Payer: BC Managed Care – PPO | Source: Ambulatory Visit | Attending: Physician Assistant | Admitting: Physician Assistant

## 2021-11-18 ENCOUNTER — Other Ambulatory Visit: Payer: Self-pay

## 2021-11-18 ENCOUNTER — Encounter (HOSPITAL_COMMUNITY): Payer: Self-pay

## 2021-11-18 VITALS — BP 143/93 | HR 93 | Temp 99.0°F | Resp 20

## 2021-11-18 DIAGNOSIS — Z8249 Family history of ischemic heart disease and other diseases of the circulatory system: Secondary | ICD-10-CM

## 2021-11-18 DIAGNOSIS — Z720 Tobacco use: Secondary | ICD-10-CM | POA: Diagnosis not present

## 2021-11-18 DIAGNOSIS — N12 Tubulo-interstitial nephritis, not specified as acute or chronic: Principal | ICD-10-CM

## 2021-11-18 DIAGNOSIS — E669 Obesity, unspecified: Secondary | ICD-10-CM | POA: Diagnosis present

## 2021-11-18 DIAGNOSIS — Z91018 Allergy to other foods: Secondary | ICD-10-CM | POA: Diagnosis not present

## 2021-11-18 DIAGNOSIS — Z79899 Other long term (current) drug therapy: Secondary | ICD-10-CM | POA: Diagnosis not present

## 2021-11-18 DIAGNOSIS — E876 Hypokalemia: Secondary | ICD-10-CM | POA: Diagnosis present

## 2021-11-18 DIAGNOSIS — K140 Glossitis: Secondary | ICD-10-CM | POA: Diagnosis present

## 2021-11-18 DIAGNOSIS — D72829 Elevated white blood cell count, unspecified: Secondary | ICD-10-CM | POA: Diagnosis present

## 2021-11-18 DIAGNOSIS — K209 Esophagitis, unspecified without bleeding: Secondary | ICD-10-CM | POA: Diagnosis present

## 2021-11-18 DIAGNOSIS — Z91012 Allergy to eggs: Secondary | ICD-10-CM | POA: Diagnosis not present

## 2021-11-18 DIAGNOSIS — K121 Other forms of stomatitis: Secondary | ICD-10-CM | POA: Diagnosis present

## 2021-11-18 DIAGNOSIS — R748 Abnormal levels of other serum enzymes: Secondary | ICD-10-CM | POA: Diagnosis present

## 2021-11-18 DIAGNOSIS — K639 Disease of intestine, unspecified: Secondary | ICD-10-CM | POA: Diagnosis not present

## 2021-11-18 DIAGNOSIS — R112 Nausea with vomiting, unspecified: Secondary | ICD-10-CM

## 2021-11-18 DIAGNOSIS — E871 Hypo-osmolality and hyponatremia: Secondary | ICD-10-CM | POA: Diagnosis present

## 2021-11-18 DIAGNOSIS — Z833 Family history of diabetes mellitus: Secondary | ICD-10-CM

## 2021-11-18 DIAGNOSIS — A419 Sepsis, unspecified organism: Secondary | ICD-10-CM

## 2021-11-18 DIAGNOSIS — N3000 Acute cystitis without hematuria: Secondary | ICD-10-CM | POA: Diagnosis not present

## 2021-11-18 DIAGNOSIS — Z20822 Contact with and (suspected) exposure to covid-19: Secondary | ICD-10-CM | POA: Diagnosis present

## 2021-11-18 DIAGNOSIS — R7989 Other specified abnormal findings of blood chemistry: Secondary | ICD-10-CM | POA: Diagnosis present

## 2021-11-18 DIAGNOSIS — Z6834 Body mass index (BMI) 34.0-34.9, adult: Secondary | ICD-10-CM | POA: Diagnosis not present

## 2021-11-18 DIAGNOSIS — N10 Acute tubulo-interstitial nephritis: Secondary | ICD-10-CM | POA: Diagnosis present

## 2021-11-18 DIAGNOSIS — F17210 Nicotine dependence, cigarettes, uncomplicated: Secondary | ICD-10-CM | POA: Diagnosis present

## 2021-11-18 LAB — CBC WITH DIFFERENTIAL/PLATELET
Abs Immature Granulocytes: 0.05 10*3/uL (ref 0.00–0.07)
Basophils Absolute: 0 10*3/uL (ref 0.0–0.1)
Basophils Relative: 0 %
Eosinophils Absolute: 0 10*3/uL (ref 0.0–0.5)
Eosinophils Relative: 0 %
HCT: 44 % (ref 36.0–46.0)
Hemoglobin: 15.1 g/dL — ABNORMAL HIGH (ref 12.0–15.0)
Immature Granulocytes: 0 %
Lymphocytes Relative: 11 %
Lymphs Abs: 1.4 10*3/uL (ref 0.7–4.0)
MCH: 30.9 pg (ref 26.0–34.0)
MCHC: 34.3 g/dL (ref 30.0–36.0)
MCV: 90 fL (ref 80.0–100.0)
Monocytes Absolute: 0.7 10*3/uL (ref 0.1–1.0)
Monocytes Relative: 5 %
Neutro Abs: 10.4 10*3/uL — ABNORMAL HIGH (ref 1.7–7.7)
Neutrophils Relative %: 84 %
Platelets: 311 10*3/uL (ref 150–400)
RBC: 4.89 MIL/uL (ref 3.87–5.11)
RDW: 12.2 % (ref 11.5–15.5)
WBC: 12.6 10*3/uL — ABNORMAL HIGH (ref 4.0–10.5)
nRBC: 0 % (ref 0.0–0.2)

## 2021-11-18 LAB — POCT URINALYSIS DIP (MANUAL ENTRY)
Glucose, UA: NEGATIVE mg/dL
Nitrite, UA: NEGATIVE
Protein Ur, POC: 100 mg/dL — AB
Spec Grav, UA: >=1.03 — AB (ref 1.010–1.025)
Urobilinogen, UA: 1 E.U./dL
pH, UA: 6 (ref 5.0–8.0)

## 2021-11-18 LAB — URINALYSIS, MICROSCOPIC (REFLEX)

## 2021-11-18 LAB — RESP PANEL BY RT-PCR (FLU A&B, COVID) ARPGX2
Influenza A by PCR: NEGATIVE
Influenza B by PCR: NEGATIVE
SARS Coronavirus 2 by RT PCR: NEGATIVE

## 2021-11-18 LAB — URINALYSIS, ROUTINE W REFLEX MICROSCOPIC
Glucose, UA: NEGATIVE mg/dL
Ketones, ur: >80 mg/dL — AB
Nitrite: NEGATIVE
Protein, ur: 100 mg/dL — AB
Specific Gravity, Urine: >1.03 — ABNORMAL HIGH (ref 1.005–1.030)
pH: 6 (ref 5.0–8.0)

## 2021-11-18 LAB — CBC
HCT: 38.9 % (ref 36.0–46.0)
Hemoglobin: 13.4 g/dL (ref 12.0–15.0)
MCH: 30.7 pg (ref 26.0–34.0)
MCHC: 34.4 g/dL (ref 30.0–36.0)
MCV: 89 fL (ref 80.0–100.0)
Platelets: 260 10*3/uL (ref 150–400)
RBC: 4.37 MIL/uL (ref 3.87–5.11)
RDW: 12.2 % (ref 11.5–15.5)
WBC: 8.1 10*3/uL (ref 4.0–10.5)
nRBC: 0 % (ref 0.0–0.2)

## 2021-11-18 LAB — COMPREHENSIVE METABOLIC PANEL
ALT: 24 U/L (ref 0–44)
AST: 30 U/L (ref 15–41)
Albumin: 3.8 g/dL (ref 3.5–5.0)
Alkaline Phosphatase: 57 U/L (ref 38–126)
Anion gap: 13 (ref 5–15)
BUN: 21 mg/dL — ABNORMAL HIGH (ref 6–20)
CO2: 20 mmol/L — ABNORMAL LOW (ref 22–32)
Calcium: 8.7 mg/dL — ABNORMAL LOW (ref 8.9–10.3)
Chloride: 102 mmol/L (ref 98–111)
Creatinine, Ser: 0.86 mg/dL (ref 0.44–1.00)
GFR, Estimated: >60 mL/min (ref >60)
Glucose, Bld: 86 mg/dL (ref 70–99)
Potassium: 3.5 mmol/L (ref 3.5–5.1)
Sodium: 135 mmol/L (ref 135–145)
Total Bilirubin: 0.5 mg/dL (ref 0.3–1.2)
Total Protein: 7.7 g/dL (ref 6.5–8.1)

## 2021-11-18 LAB — TROPONIN I (HIGH SENSITIVITY)
Troponin I (High Sensitivity): 3 ng/L (ref <18)
Troponin I (High Sensitivity): 5 ng/L (ref <18)

## 2021-11-18 LAB — I-STAT BETA HCG BLOOD, ED (MC, WL, AP ONLY): I-stat hCG, quantitative: <5 m[IU]/mL (ref <5)

## 2021-11-18 LAB — LACTIC ACID, PLASMA: Lactic Acid, Venous: 1.1 mmol/L (ref 0.5–1.9)

## 2021-11-18 LAB — CREATININE, SERUM
Creatinine, Ser: 0.72 mg/dL (ref 0.44–1.00)
GFR, Estimated: >60 mL/min (ref >60)

## 2021-11-18 LAB — LIPASE, BLOOD: Lipase: 110 U/L — ABNORMAL HIGH (ref 11–51)

## 2021-11-18 LAB — GROUP A STREP BY PCR: Group A Strep by PCR: NOT DETECTED

## 2021-11-18 MED ORDER — ONDANSETRON HCL 4 MG PO TABS: 4.0000 mg | ORAL_TABLET | Freq: Four times a day (QID) | ORAL | Status: DC | PRN

## 2021-11-18 MED ORDER — MORPHINE SULFATE (PF) 4 MG/ML IV SOLN
4.0000 mg | Freq: Once | INTRAVENOUS | Status: AC
  Administered 2021-11-18: 4 mg via INTRAVENOUS
  Filled 2021-11-18: qty 1

## 2021-11-18 MED ORDER — LACTATED RINGERS IV BOLUS (SEPSIS)
1000.0000 mL | Freq: Once | INTRAVENOUS | Status: AC
  Administered 2021-11-18: 1000 mL via INTRAVENOUS

## 2021-11-18 MED ORDER — PANTOPRAZOLE SODIUM 20 MG PO TBEC
20.0000 mg | DELAYED_RELEASE_TABLET | Freq: Two times a day (BID) | ORAL | Status: DC
  Administered 2021-11-18: 20 mg via ORAL
  Filled 2021-11-18: qty 1

## 2021-11-18 MED ORDER — DEXTROSE IN LACTATED RINGERS 5 % IV SOLN: INTRAVENOUS | Status: DC

## 2021-11-18 MED ORDER — ENOXAPARIN SODIUM 40 MG/0.4ML IJ SOSY: 40.0000 mg | PREFILLED_SYRINGE | INTRAMUSCULAR | Status: DC

## 2021-11-18 MED ORDER — IOHEXOL 350 MG/ML SOLN
75.0000 mL | Freq: Once | INTRAVENOUS | Status: AC | PRN
  Administered 2021-11-18: 75 mL via INTRAVENOUS

## 2021-11-18 MED ORDER — HYDROMORPHONE HCL 1 MG/ML IJ SOLN
1.0000 mg | INTRAMUSCULAR | Status: DC | PRN
  Administered 2021-11-18 – 2021-11-23 (×27): 1 mg via INTRAVENOUS
  Filled 2021-11-18 (×28): qty 1

## 2021-11-18 MED ORDER — LACTATED RINGERS IV SOLN: INTRAVENOUS | Status: DC

## 2021-11-18 MED ORDER — SODIUM CHLORIDE 0.9 % IV SOLN
1.0000 g | INTRAVENOUS | Status: DC
  Administered 2021-11-19: 1 g via INTRAVENOUS
  Filled 2021-11-18: qty 10

## 2021-11-18 MED ORDER — ONDANSETRON HCL 4 MG/2ML IJ SOLN
4.0000 mg | Freq: Once | INTRAMUSCULAR | Status: AC
  Administered 2021-11-18: 4 mg via INTRAVENOUS
  Filled 2021-11-18: qty 2

## 2021-11-18 MED ORDER — LACTATED RINGERS IV BOLUS
1000.0000 mL | Freq: Once | INTRAVENOUS | Status: AC
  Administered 2021-11-18: 1000 mL via INTRAVENOUS

## 2021-11-18 MED ORDER — LACTATED RINGERS IV BOLUS (SEPSIS)
500.0000 mL | Freq: Once | INTRAVENOUS | Status: AC
  Administered 2021-11-18: 500 mL via INTRAVENOUS

## 2021-11-18 MED ORDER — SODIUM CHLORIDE 0.9 % IV SOLN: 1.0000 g | INTRAVENOUS | Status: DC

## 2021-11-18 MED ORDER — ENOXAPARIN SODIUM 60 MG/0.6ML IJ SOSY
50.0000 mg | PREFILLED_SYRINGE | INTRAMUSCULAR | Status: DC
  Administered 2021-11-18 – 2021-11-22 (×5): 50 mg via SUBCUTANEOUS
  Filled 2021-11-18 (×6): qty 0.6

## 2021-11-18 MED ORDER — SODIUM CHLORIDE 0.9 % IV SOLN
2.0000 g | Freq: Once | INTRAVENOUS | Status: AC
  Administered 2021-11-18: 2 g via INTRAVENOUS
  Filled 2021-11-18: qty 20

## 2021-11-18 MED ORDER — ONDANSETRON HCL 4 MG/2ML IJ SOLN
4.0000 mg | Freq: Four times a day (QID) | INTRAMUSCULAR | Status: DC | PRN
  Administered 2021-11-19 – 2021-11-20 (×3): 4 mg via INTRAVENOUS
  Filled 2021-11-18 (×4): qty 2

## 2021-11-18 MED ORDER — ACETAMINOPHEN 325 MG PO TABS
650.0000 mg | ORAL_TABLET | Freq: Four times a day (QID) | ORAL | Status: DC | PRN
  Administered 2021-11-19 – 2021-11-20 (×6): 650 mg via ORAL
  Filled 2021-11-18 (×6): qty 2

## 2021-11-18 MED ORDER — IOHEXOL 350 MG/ML SOLN
80.0000 mL | Freq: Once | INTRAVENOUS | Status: AC | PRN
  Administered 2021-11-18: 80 mL via INTRAVENOUS

## 2021-11-18 MED ORDER — ACETAMINOPHEN 650 MG RE SUPP: 650.0000 mg | Freq: Four times a day (QID) | RECTAL | Status: DC | PRN

## 2021-11-18 NOTE — ED Notes (Signed)
MD informed of pt request for pain med.

## 2021-11-18 NOTE — ED Notes (Signed)
Dr. Wyvonnia Dusky and [place], [place] informed of pt return from CT with upper abdominal pain/epigastric pain and pt was almost pain free after pain med administration prior to CT.

## 2021-11-18 NOTE — ED Provider Notes (Signed)
[facility] EMERGENCY DEPARTMENT Provider Note   CSN: [id number] Arrival date & time: 11/18/21  1246     History  Chief Complaint  Patient presents with   Dysuria   Hematuria        Sore Throat    [name] is a 46 y.o. female.  46 year old female presents today for evaluation of 5-day duration of sore throat, left flank pain, dysuria associated with nausea and vomiting.  She was evaluated at urgent care and referred to the emergency room for additional eval and management.  She does appear uncomfortable on exam.  She denies prior history of kidney stones.  Denies fever prior to arrival.  Endorses surgical history of cholecystectomy, as well as partial hysterectomy.  The history is provided by the patient. No language interpreter was used.       Home Medications Prior to Admission medications   Medication Sig Start Date End Date Taking? Authorizing Provider  ibuprofen (ADVIL,MOTRIN) 200 MG tablet Take 600-800 mg by mouth every 6 (six) hours as needed for mild pain. For pain    [provider]  pantoprazole (PROTONIX) 20 MG tablet Take 1 tablet (20 mg total) by mouth 2 (two) times daily. 05/19/20   Arnaldo Natal, MD  metoCLOPramide (REGLAN) 10 MG tablet Take 1 tablet (10 mg total) by mouth every 6 (six) hours. Patient not taking: Reported on 05/19/2020 03/21/11 05/19/20  Charlann Lange, PA-C  sucralfate (CARAFATE) 1 g tablet Take 1 tablet (1 g total) by mouth 4 (four) times daily. Patient not taking: Reported on 05/19/2020 04/26/20 05/19/20  Lacretia Leigh, MD      Allergies    Patient has no known allergies.    Review of Systems   Review of Systems  Constitutional:  Positive for appetite change. Negative for chills and fever.  Respiratory:  Negative for shortness of breath.   Gastrointestinal:  Positive for abdominal pain, nausea and vomiting.  Genitourinary:  Positive for dysuria and flank pain.  All other systems reviewed and are  negative.   Physical Exam Updated Vital Signs BP (!) 137/92 (BP Location: Left Arm)   Pulse 94   Temp 99.2 F (37.3 C) (Oral)   Resp 20   Ht 5\' 8"  (1.727 m)   Wt 104.3 kg   SpO2 97%   BMI 34.97 kg/m  Physical Exam Vitals and nursing note reviewed.  Constitutional:      General: She is not in acute distress.    Appearance: Normal appearance. She is not ill-appearing.     Comments: Uncomfortable appearing on exam  HENT:     Head: Normocephalic and atraumatic.     Nose: Nose normal.  Eyes:     General: No scleral icterus.    Extraocular Movements: Extraocular movements intact.     Conjunctiva/sclera: Conjunctivae normal.  Cardiovascular:     Rate and Rhythm: Normal rate and regular rhythm.     Pulses: Normal pulses.  Pulmonary:     Effort: Pulmonary effort is normal. No respiratory distress.     Breath sounds: Normal breath sounds. No wheezing or rales.  Abdominal:     General: There is no distension.     Palpations: Abdomen is soft.     Tenderness: There is no abdominal tenderness. There is left CVA tenderness. There is no right CVA tenderness or guarding.  Musculoskeletal:        General: Normal range of motion.     Cervical back: Normal range of motion.  Skin:    General: Skin is warm and dry.  Neurological:     General: No focal deficit present.     Mental Status: She is alert. Mental status is at baseline.     ED Results / Procedures / Treatments   Labs (all labs ordered are listed, but only abnormal results are displayed) Labs Reviewed  CBC WITH DIFFERENTIAL/PLATELET - Abnormal; Notable for the following components:      Result Value   WBC 12.6 (*)    Hemoglobin 15.1 (*)    Neutro Abs 10.4 (*)    All other components within normal limits  COMPREHENSIVE METABOLIC PANEL - Abnormal; Notable for the following components:   CO2 20 (*)    BUN 21 (*)    Calcium 8.7 (*)    All other components within normal limits  GROUP A STREP BY PCR  RESP PANEL BY RT-PCR  (FLU A&B, COVID) ARPGX2  URINALYSIS, ROUTINE W REFLEX MICROSCOPIC  I-STAT BETA HCG BLOOD, ED ([facility], [facility], [facility] ONLY)    EKG None  Radiology No results found.  Procedures .Critical Care  Performed by: Marita Kansas, PA-C Authorized by: Marita Kansas, PA-C   Critical care provider statement:    Critical care time (minutes):  40   Critical care was necessary to treat or prevent imminent or life-threatening deterioration of the following conditions:  Sepsis   Critical care was time spent personally by me on the following activities:  Development of treatment plan with patient or surrogate, discussions with consultants, evaluation of patient's response to treatment, examination of patient, ordering and review of laboratory studies, ordering and review of radiographic studies, ordering and performing treatments and interventions, pulse oximetry, re-evaluation of patient's condition and review of old charts   Care discussed with: admitting provider       Medications Ordered in ED Medications  morphine (PF) 4 MG/ML injection 4 mg (has no administration in time range)  ondansetron (ZOFRAN) injection 4 mg (has no administration in time range)  lactated ringers bolus 1,000 mL (has no administration in time range)    ED Course/ Medical Decision Making/ A&P Clinical Course as of 11/18/21 2012  Thu Nov 18, 2021  1730 Patient after return from CT abdomen pelvis developed sudden onset severe chest pain.  Unable to find a comfortable position.  She is writhing in pain.  Did not have similar pain prior to arrival to the emergency room.  Concern for ACS versus dissection versus pancreatitis.  Troponin, EKG, chest x-ray, dissection study, lipase ordered.  Repeat dose of morphine ordered. [AA]    Clinical Course User Index [AA] Marita Kansas, PA-C                           Medical Decision Making Amount and/or Complexity of Data Reviewed Labs: ordered. Radiology: ordered.  Risk Prescription drug  management. Decision regarding hospitalization.   Medical Decision Making / ED Course   This patient presents to the ED for concern of left flank pain, oral lesions, sore throat, this involves an extensive number of treatment options, and is a complaint that carries with it a high risk of complications and morbidity.  The differential diagnosis includes UTI, pyelonephritis, nephrolithiasis, appendicitis, diverticulitis, strep, oral zoster  MDM: 46 year old female presents with above-mentioned complaints.  Uncomfortable appearing on exam but without apparent distress.  She does have left CVA tenderness, abdomen is soft and nontender.  Endorses dysuria.  Oral lesions are present.  CBC  shows leukocytosis with left shift.  Preserved renal function.  UA from the urgent care shows large amounts of leukocytes, blood, and ketones.  Given these changes on the UA we will go ahead and give dose of Rocephin to treat UTI while awaiting the rest of work-up.  Will give pain medication, fluids and reevaluate.  CBC reveals leukocytosis of 12.6 with mild left shift, UA reveals signs of UTI as well as ketones, CMP with preserved renal function, normal electrolytes.  Lipase elevated to 110.  CT abdomen pelvis without acute findings to explain patient's symptoms.  Dissection study with no evidence of aortic dissection.  Patient's symptoms may be accommodation of pyelonephritis plus pancreatitis.  Nonetheless given her leukocytosis, tachycardia, pyelonephritis she is SIRS positive.  Sepsis work-up ordered.  She also has a low-grade temp at 99.7.  Discussed with hospitalist who will evaluate patient for admission.  Since [place] dissection study she has received 2 additional doses of pain medication.   Lab Tests: -I ordered, reviewed, and interpreted labs.   The pertinent results include:   Labs Reviewed  CBC WITH DIFFERENTIAL/PLATELET - Abnormal; Notable for the following components:      Result Value   WBC 12.6 (*)     Hemoglobin 15.1 (*)    Neutro Abs 10.4 (*)    All other components within normal limits  COMPREHENSIVE METABOLIC PANEL - Abnormal; Notable for the following components:   CO2 20 (*)    BUN 21 (*)    Calcium 8.7 (*)    All other components within normal limits  GROUP A STREP BY PCR  RESP PANEL BY RT-PCR (FLU A&B, COVID) ARPGX2  URINALYSIS, ROUTINE W REFLEX MICROSCOPIC  I-STAT BETA HCG BLOOD, ED ([facility], [facility], [facility] ONLY)      EKG  EKG Interpretation  Date/Time:    Ventricular Rate:    PR Interval:    QRS Duration:   QT Interval:    QTC Calculation:   R Axis:     Text Interpretation:           Imaging Studies ordered: I ordered imaging studies including chest x-ray, dissection study, CT abdomen pelvis with contrast I independently visualized and interpreted imaging. I agree with the radiologist interpretation   Medicines ordered and prescription drug management: Meds ordered this encounter  Medications   morphine (PF) 4 MG/ML injection 4 mg   ondansetron (ZOFRAN) injection 4 mg   lactated ringers bolus 1,000 mL    -I have reviewed the patients home medicines and have made adjustments as needed  Critical interventions Large volume resuscitation, antibiotics  Reevaluation: After the interventions noted above, I reevaluated the patient and found that they have :improved  Co morbidities that complicate the patient evaluation History reviewed. No pertinent past medical history.    Dispostion: Patient discussed with hospitalist who will evaluate patient for admission.   Final Clinical Impression(s) / ED Diagnoses Final diagnoses:  None    Rx / DC Orders ED Discharge Orders     None         Marita Kansas, PA-C 11/18/21 2025    Glynn Octave, MD 11/18/21 2251

## 2021-11-18 NOTE — H&P (Signed)
History and Physical    Patient: [name] JYN:[id number] DOB: [birth year] DOA: 11/18/2021 DOS: the patient was seen and examined on 11/18/2021 PCP: Pcp, No  Patient coming from: Home  Chief Complaint:  Chief Complaint  Patient presents with   Dysuria   Hematuria        Sore Throat   HPI: [name] is a 46 y.o. female with medical history significant of tobacco abuse, previous C-section otherwise no significant past medical history presenting with 2 days of dysuria hematuria and ulcers in the mouth.  This has affected patient's ability to eat.  She has had left flank pain associated with nausea and some vomiting.  Patient was seen in urgent care center and referred to the ER.  While in the ER she has severe epigastric pain that has not resolved with IV Dilaudid.  No history of alcohol intake.  Further evaluation showed mildly elevated lipase.  Urinalysis indicated UTI.  Patient is suspected to have left pyelonephritis with possible early acute pancreatitis.  Patient being admitted to the hospital for further evaluation and treatment.  She has had prior history of cholecystectomy and hysterectomy.  Otherwise no prior abdominal issues.  Review of Systems: As mentioned in the history of present illness. All other systems reviewed and are negative. History reviewed. No pertinent past medical history. Past Surgical History:  Procedure Laterality Date   ABDOMINAL HYSTERECTOMY     CESAREAN SECTION     CHOLECYSTECTOMY     MANDIBLE FRACTURE SURGERY     Social History:  reports that she has been smoking cigarettes. She has a 7.50 pack-year smoking history. She does not have any smokeless tobacco history on file. She reports current alcohol use. She reports that she does not use drugs.  No Known Allergies  Family History  Problem Relation Age of Onset   Hypertension Mother    Hypertension Maternal Grandmother    Cancer Maternal Grandmother    Cancer Maternal Grandfather     Hypertension Paternal Grandmother    Diabetes Paternal Grandmother     Prior to Admission medications   Medication Sig Start Date End Date Taking? Authorizing Provider  ibuprofen (ADVIL,MOTRIN) 200 MG tablet Take 600-800 mg by mouth every 6 (six) hours as needed for mild pain. For pain    [provider]  pantoprazole (PROTONIX) 20 MG tablet Take 1 tablet (20 mg total) by mouth 2 (two) times daily. 05/19/20   Koleen Distance, MD  metoCLOPramide (REGLAN) 10 MG tablet Take 1 tablet (10 mg total) by mouth every 6 (six) hours. Patient not taking: Reported on 05/19/2020 03/21/11 05/19/20  Elpidio Anis, PA-C  sucralfate (CARAFATE) 1 g tablet Take 1 tablet (1 g total) by mouth 4 (four) times daily. Patient not taking: Reported on 05/19/2020 04/26/20 05/19/20  Lorre Nick, MD    Physical Exam: Vitals:   11/18/21 1730 11/18/21 1756 11/18/21 1847 11/18/21 1938  BP: (!) 157/99 (!) 157/84 122/69   Pulse: (!) 101 (!) 102 94   Resp: 18 18 18    Temp:    99.7 F (37.6 C)  TempSrc:    Oral  SpO2: 97% 97% 95%   Weight:      Height:       Constitutional: Obese, in mild distress due to pain, calm, comfortable Eyes: PERRL, lids and conjunctivae normal ENMT: Mucous membranes are moist. Posterior pharynx clear of any exudate or lesions.Normal dentition.  Neck: normal, supple, no masses, no thyromegaly Respiratory: clear to auscultation bilaterally, no  wheezing, no crackles. Normal respiratory effort. No accessory muscle use.  Cardiovascular: Sinus tachycardia, no murmurs / rubs / gallops. No extremity edema. 2+ pedal pulses. No carotid bruits.  Abdomen: Mild epigastric and left CTA tenderness, no masses palpated. No hepatosplenomegaly. Bowel sounds positive.  Musculoskeletal: Good range of motion, no joint swelling or tenderness, Skin: no rashes, lesions, ulcers. No induration Neurologic: CN 2-12 grossly intact. Sensation intact, DTR normal. Strength 5/5 in all 4.  Psychiatric: Normal judgment and  insight. Alert and oriented x 3. Normal mood  Data Reviewed:  Lipase is 110 , white count 12.6 hemoglobin 44.  Calcium 8.7 otherwise chemistry appeared to be within normal.  Acute viral screen is negative.  Urinalysis showed cloudy urine with moderate hemoglobin leukocytes moderate ketones more than 80.  Many bacteria and WBC 11-20.  Nitrite is negative but leukocyte positive.  CT angiogram of the chest shows no evidence of aortic aneurysm or dissection trace bilateral pleural effusion and mild symmetric esophageal wall thickening suggestive of esophagitis CT abdomen and pelvis showed no acute findings in the abdomen pelvis no evidence of intestinal obstruction or pneumoperitoneum Assessment and Plan:  #1 suspected acute pyelonephritis: CT showed no evidence of [place] but patient has flank pain, leukocytosis, evidence of UTI.  We will admit the patient with IV Rocephin.  Get urine and blood cultures.  Symptomatic treatment for the pain.  Follow cultures and adjust antibiotics.  #2 elevated lipase with epigastric pain: Again no CT evidence of acute pancreatitis but patient is having significant epigastric pain as well with elevated lipase.  Will observe patient.  Pain now has subsided.  I will challenge patient with clear liquid diet for now.  If pain persist or returns we will keep her n.p.o. and treat for pancreatitis.  She does not drink alcohol.  She also has had cholecystectomy.  If this is pancreatitis could be due to idiopathic reasons or secondary to hyperlipidemia we will do fasting level at that point.  #3 obesity: Dietary counseling  #4 tobacco abuse: Counseling provided.  Offered nicotine patch      Advance Care Planning:   Code Status: Not on file full code  Consults: None  Family Communication: Daughter at bedside  Severity of Illness: The appropriate patient status for this patient is OBSERVATION. Observation status is judged to be reasonable and necessary in order to provide the  required intensity of service to ensure the patient's safety. The patient's presenting symptoms, physical exam findings, and initial radiographic and laboratory data in the context of their medical condition is felt to place them at decreased risk for further clinical deterioration. Furthermore, it is anticipated that the patient will be medically stable for discharge from the hospital within 2 midnights of admission.   AuthorBarbette Merino, MD 11/18/2021 8:10 PM  For on call review [email or web address].

## 2021-11-18 NOTE — ED Notes (Signed)
Patient is being discharged from the Urgent Care and sent to the Emergency Department via personal vehicle . Per Provider Ewell Poe, patient is in need of higher level of care due to possible pylonephritis. Patient is aware and verbalizes understanding of plan of care.   Vitals:   11/18/21 1156  BP: (!) 143/93  Pulse: 93  Resp: 20  Temp: 99 F (37.2 C)  SpO2: 97%

## 2021-11-18 NOTE — ED Provider Triage Note (Signed)
Emergency Medicine Provider Triage Evaluation Note  [name] , a 46 y.o. female  was evaluated in triage.  Pt complains of 4 days of sore throat, fever, body aches, dysuria.  Patient has developed sores in her mouth which has made it difficult to swallow.  No other skin rashes, palmar or rash on her feet.  No known sick contacts.  She took a home COVID test which was negative.  Seen in urgent care and referred to the emergency department today.  Only chronic medication is for blood pressure.  She was prescribed an antibiotic by [facility] 2 days ago.  Review of Systems  Positive: Mouth sores, dysuria, fever Negative: Skin rash  Physical Exam  There were no vitals taken for this visit. Gen:   Awake, no distress   Resp:  Normal effort  MSK:   Moves extremities without difficulty  Other:  Patient with stomatitis noted, no rash on palms or soles  Medical Decision Making  Medically screening exam initiated at 12:58 PM.  Appropriate orders placed.  Lahari Suttles Masek was informed that the remainder of the evaluation will be completed by another provider, this initial triage assessment does not replace that evaluation, and the importance of remaining in the ED until their evaluation is complete.     Carlisle Cater, PA-C 11/18/21 1301

## 2021-11-18 NOTE — ED Provider Notes (Signed)
Patient here today with severe dysuria, nausea, vomiting, chills, fever, body aches that has progressively worsened over the last 5 days. She has tried treating at home without improvement. She has also developed painful lesions to her mouth and throat. Patient appears extremely uncomfortable on exam. Encouraged patient be evaluated in the ED for stat labs and imaging, fluids, etc. Patient expresses understanding. Family member will transport her via [facility].    Francene Finders, PA-C 11/18/21 1225

## 2021-11-18 NOTE — ED Triage Notes (Signed)
Patient reports sore throat since Sunday with sores in her mouth which has caused her to not be able to eat or drink much x 4 days.  Reports went to UC and was sent here bc she was having burning with urination and blood in her urine and high ketones.

## 2021-11-18 NOTE — ED Notes (Signed)
MD at bedside to provide update.

## 2021-11-18 NOTE — ED Triage Notes (Signed)
Pt presents with painful lesions in mouth & throat, generalized body aches, chills, vomiting and pain during urination X 5 days.

## 2021-11-19 ENCOUNTER — Inpatient Hospital Stay (HOSPITAL_COMMUNITY): Payer: BC Managed Care – PPO

## 2021-11-19 DIAGNOSIS — N10 Acute tubulo-interstitial nephritis: Secondary | ICD-10-CM | POA: Diagnosis not present

## 2021-11-19 LAB — LIPASE, BLOOD: Lipase: 43 U/L (ref 11–51)

## 2021-11-19 LAB — COMPREHENSIVE METABOLIC PANEL
ALT: 175 U/L — ABNORMAL HIGH (ref 0–44)
AST: 256 U/L — ABNORMAL HIGH (ref 15–41)
Albumin: 3 g/dL — ABNORMAL LOW (ref 3.5–5.0)
Alkaline Phosphatase: 201 U/L — ABNORMAL HIGH (ref 38–126)
Anion gap: 9 (ref 5–15)
BUN: 9 mg/dL (ref 6–20)
CO2: 25 mmol/L (ref 22–32)
Calcium: 7.7 mg/dL — ABNORMAL LOW (ref 8.9–10.3)
Chloride: 99 mmol/L (ref 98–111)
Creatinine, Ser: 0.71 mg/dL (ref 0.44–1.00)
GFR, Estimated: >60 mL/min (ref >60)
Glucose, Bld: 110 mg/dL — ABNORMAL HIGH (ref 70–99)
Potassium: 3.1 mmol/L — ABNORMAL LOW (ref 3.5–5.1)
Sodium: 133 mmol/L — ABNORMAL LOW (ref 135–145)
Total Bilirubin: 1 mg/dL (ref 0.3–1.2)
Total Protein: 6.2 g/dL — ABNORMAL LOW (ref 6.5–8.1)

## 2021-11-19 LAB — HEPATITIS PANEL, ACUTE
HCV Ab: NONREACTIVE
Hep A IgM: NONREACTIVE
Hep B C IgM: NONREACTIVE
Hepatitis B Surface Ag: NONREACTIVE

## 2021-11-19 LAB — CBC
HCT: 36.7 % (ref 36.0–46.0)
Hemoglobin: 13.1 g/dL (ref 12.0–15.0)
MCH: 31.3 pg (ref 26.0–34.0)
MCHC: 35.7 g/dL (ref 30.0–36.0)
MCV: 87.6 fL (ref 80.0–100.0)
Platelets: 252 10*3/uL (ref 150–400)
RBC: 4.19 MIL/uL (ref 3.87–5.11)
RDW: 12.2 % (ref 11.5–15.5)
WBC: 6.7 10*3/uL (ref 4.0–10.5)
nRBC: 0 % (ref 0.0–0.2)

## 2021-11-19 LAB — HIV ANTIBODY (ROUTINE TESTING W REFLEX): HIV Screen 4th Generation wRfx: NONREACTIVE

## 2021-11-19 MED ORDER — METRONIDAZOLE 500 MG/100ML IV SOLN
500.0000 mg | Freq: Two times a day (BID) | INTRAVENOUS | Status: DC
  Administered 2021-11-19 – 2021-11-21 (×6): 500 mg via INTRAVENOUS
  Filled 2021-11-19 (×6): qty 100

## 2021-11-19 MED ORDER — PANTOPRAZOLE SODIUM 40 MG IV SOLR
40.0000 mg | Freq: Two times a day (BID) | INTRAVENOUS | Status: DC
  Administered 2021-11-19 – 2021-11-23 (×9): 40 mg via INTRAVENOUS
  Filled 2021-11-19 (×9): qty 10

## 2021-11-19 MED ORDER — ALUM & MAG HYDROXIDE-SIMETH 200-200-20 MG/5ML PO SUSP
30.0000 mL | Freq: Four times a day (QID) | ORAL | Status: DC | PRN
  Administered 2021-11-19 – 2021-11-23 (×5): 30 mL via ORAL
  Filled 2021-11-19 (×6): qty 30

## 2021-11-19 MED ORDER — POTASSIUM CHLORIDE IN NACL 40-0.9 MEQ/L-% IV SOLN
INTRAVENOUS | Status: DC
  Administered 2021-11-20: 75 mL/h via INTRAVENOUS
  Filled 2021-11-19 (×6): qty 1000

## 2021-11-19 MED ORDER — BENZOCAINE 10 % MT GEL
1.0000 | Freq: Four times a day (QID) | OROMUCOSAL | Status: DC | PRN
  Administered 2021-11-20 (×2): 1 via OROMUCOSAL
  Filled 2021-11-19: qty 9

## 2021-11-19 MED ORDER — PROCHLORPERAZINE EDISYLATE 10 MG/2ML IJ SOLN
5.0000 mg | Freq: Four times a day (QID) | INTRAMUSCULAR | Status: AC | PRN
  Administered 2021-11-19 – 2021-11-20 (×3): 5 mg via INTRAVENOUS
  Filled 2021-11-19 (×3): qty 2

## 2021-11-19 NOTE — ED Notes (Signed)
MD Nevada Crane paged about pt requesting something else to help with nausea d/t Zofran not working.

## 2021-11-19 NOTE — Plan of Care (Signed)

## 2021-11-19 NOTE — Consult Note (Signed)
[facility] Gastroenterology Consult  Referring Provider: No ref. provider found Primary Care Physician:  Pcp, No Primary Gastroenterologist: [facility])  Reason for Consultation: severe abdominal pain  SUBJECTIVE:   HPI: [name] is a 46 y.o. female with past medical history significant for previous cholecystectomy. She has undergone hysterectomy and cesarean section in the past as well. She noted having onset of dysuria and fever on Sunday. She sought medical care and received antibiotic to take which initially helped, though dysuria recurred. She has been having nausea and vomiting during this time as well. She noted having a loose bowel movement over the weekend which was non-bloody. She was taking acetaminophen prior to current presentation, estimated 1.8 gm per day. Rare alcohol use. She has also been experiencing oral ulcerations which are very painful for her as well since past weekend. She has no prior colonoscopy.   Labs on presentation showed Na 133, K 3.1, BUN/Cr 9/0.71, ALP 201, AST/ALT 256/175, total bilirubin 1.0, lipase 110, WBC 6.7, Hgb 13.1, PLT 252. CT scan of abdomen and pelvis on 11/18/21 showed circumferential wall thickening of short segment of sigmoid colon, no pericolic stranding, slight prominence of intrahepatic bile ducts.  CT angiogram of chest/abdomen and pelvis showed mild symmetric esophageal wall thickening suggestive of esophagitis, moderate formed stool through colon.  History reviewed. No pertinent past medical history. Past Surgical History:  Procedure Laterality Date   ABDOMINAL HYSTERECTOMY     CESAREAN SECTION     CHOLECYSTECTOMY     MANDIBLE FRACTURE SURGERY     Prior to Admission medications   Medication Sig Start Date End Date Taking? Authorizing Provider  acetaminophen (TYLENOL) 500 MG tablet Take 1,500 mg by mouth every 6 (six) hours as needed for moderate pain.   Yes [provider]  amLODipine (NORVASC) 5 MG  tablet Take 5 mg by mouth daily. 10/28/21  Yes [provider]  amoxicillin (AMOXIL) 500 MG tablet Take 500 mg by mouth 3 (three) times daily. 11/16/21  Yes [provider]  ibuprofen (ADVIL,MOTRIN) 200 MG tablet Take 600-800 mg by mouth every 6 (six) hours as needed for mild pain. For pain   Yes [provider]  pantoprazole (PROTONIX) 20 MG tablet Take 1 tablet (20 mg total) by mouth 2 (two) times daily. Patient not taking: Reported on 11/18/2021 05/19/20   Koleen Distance, MD  metoCLOPramide (REGLAN) 10 MG tablet Take 1 tablet (10 mg total) by mouth every 6 (six) hours. Patient not taking: Reported on 05/19/2020 03/21/11 05/19/20  Elpidio Anis, PA-C  sucralfate (CARAFATE) 1 g tablet Take 1 tablet (1 g total) by mouth 4 (four) times daily. Patient not taking: Reported on 05/19/2020 04/26/20 05/19/20  Lorre Nick, MD   Current Facility-Administered Medications  Medication Dose Route Frequency Provider Last Rate Last Admin   0.9 % NaCl with KCl 40 mEq / L  infusion   Intravenous Continuous Glade Lloyd, MD 125 mL/hr at 11/19/21 0829 New Bag at 11/19/21 0829   acetaminophen (TYLENOL) tablet 650 mg  650 mg Oral Q6H PRN Rometta Emery, MD   650 mg at 11/19/21 1157   Or   acetaminophen (TYLENOL) suppository 650 mg  650 mg Rectal Q6H PRN Rometta Emery, MD       alum & mag hydroxide-simeth (MAALOX/MYLANTA) 200-200-20 MG/5ML suspension 30 mL  30 mL Oral Q6H PRN Hanley Ben, Kshitiz, MD   30 mL at 11/19/21 1205   cefTRIAXone (ROCEPHIN) 1 g in sodium chloride 0.9 %  100 mL IVPB  1 g Intravenous Q24H Rometta EmeryGarba, Mohammad L, MD   Stopped at 11/19/21 1316   enoxaparin (LOVENOX) injection 50 mg  50 mg Subcutaneous Q24H Earlie LouGarba, Mohammad L, MD   50 mg at 11/18/21 2227   HYDROmorphone (DILAUDID) injection 1 mg  1 mg Intravenous Q3H PRN Rometta EmeryGarba, Mohammad L, MD   1 mg at 11/19/21 1202   metroNIDAZOLE (FLAGYL) IVPB 500 mg  500 mg Intravenous Q12H Alekh, Kshitiz, MD 100 mL/hr at 11/19/21 1319 500 mg at  11/19/21 1319   ondansetron (ZOFRAN) tablet 4 mg  4 mg Oral Q6H PRN Rometta EmeryGarba, Mohammad L, MD       Or   ondansetron (ZOFRAN) injection 4 mg  4 mg Intravenous Q6H PRN Rometta EmeryGarba, Mohammad L, MD   4 mg at 11/19/21 0513   pantoprazole (PROTONIX) injection 40 mg  40 mg Intravenous Q12H Alekh, Kshitiz, MD   40 mg at 11/19/21 0818   prochlorperazine (COMPAZINE) injection 5 mg  5 mg Intravenous Q6H PRN Dow AdolphHall, Carole N, DO   5 mg at 11/19/21 [id number]   Allergies as of 11/18/2021 - Review Complete 11/18/2021  Allergen Reaction Noted   Banana Nausea And Vomiting 11/18/2021   Cucumber extract Nausea And Vomiting 11/18/2021   Eggs or egg-derived products Nausea And Vomiting 11/18/2021   Family History  Problem Relation Age of Onset   Hypertension Mother    Hypertension Maternal Grandmother    Cancer Maternal Grandmother    Cancer Maternal Grandfather    Hypertension Paternal Grandmother    Diabetes Paternal Grandmother    Social History   Socioeconomic History   Marital status: Married    Spouse name: Not on file   Number of children: Not on file   Years of education: Not on file   Highest education level: Not on file  Occupational History   Not on file  Tobacco Use   Smoking status: Every Day    Packs/day: 0.50    Years: 15.00    Total pack years: 7.50    Types: Cigarettes   Smokeless tobacco: Not on file  Substance and Sexual Activity   Alcohol use: Yes   Drug use: No   Sexual activity: Yes  Other Topics Concern   Not on file  Social History Narrative   Not on file   Social Determinants of Health   Financial Resource Strain: Not on file  Food Insecurity: Not on file  Transportation Needs: Not on file  Physical Activity: Not on file  Stress: Not on file  Social Connections: Not on file  Intimate Partner Violence: Not on file   Review of Systems:  Review of Systems  Constitutional:  Positive for fever.  Respiratory:  Negative for shortness of breath.   Cardiovascular:  Negative  for chest pain.  Gastrointestinal:  Positive for abdominal pain, diarrhea, nausea and vomiting. Negative for blood in stool.  Genitourinary:  Positive for dysuria.    OBJECTIVE:   Temp:  [99.1 F (37.3 C)-102.7 F (39.3 C)] 99.1 F (37.3 C) (11/03 1342) Pulse Rate:  [70-102] 81 (11/03 1342) Resp:  [15-19] 16 (11/03 1315) BP: (105-191)/(53-113) 141/80 (11/03 1342) SpO2:  [91 %-98 %] 94 % (11/03 1342)   Physical Exam Constitutional:      General: She is not in acute distress.    Appearance: She is not ill-appearing, toxic-appearing or diaphoretic.  HENT:     Mouth/Throat:     Comments: Oral ulceration(s) as in pictures. Cardiovascular:  Rate and Rhythm: Normal rate and regular rhythm.  Pulmonary:     Effort: No respiratory distress.     Breath sounds: Normal breath sounds.     Comments: No supplemental oxygen in place at time of exam. Abdominal:     General: Bowel sounds are normal. There is no distension.     Palpations: Abdomen is soft.     Tenderness: There is abdominal tenderness (Epigastric). There is no guarding.  Musculoskeletal:     Right lower leg: No edema.     Left lower leg: No edema.  Neurological:     Mental Status: She is alert.        Labs: Recent Labs    11/18/21 1307 11/18/21 2230 11/19/21 0520  WBC 12.6* 8.1 6.7  HGB 15.1* 13.4 13.1  HCT 44.0 38.9 36.7  PLT 311 260 252   BMET Recent Labs    11/18/21 1307 11/18/21 2230 11/19/21 0520  NA 135  --  133*  K 3.5  --  3.1*  CL 102  --  99  CO2 20*  --  25  GLUCOSE 86  --  110*  BUN 21*  --  9  CREATININE 0.86 0.72 0.71  CALCIUM 8.7*  --  7.7*   LFT Recent Labs    11/19/21 0520  PROT 6.2*  ALBUMIN 3.0*  AST 256*  ALT 175*  ALKPHOS 201*  BILITOT 1.0   PT/INR No results for input(s): "LABPROT", "INR" in the last 72 hours.  Diagnostic imaging: US Abdomen Limited RUQ (LIVER/GB)  Result Date: 11/19/2021 CLINICAL DATA:  Elevated liver enzymes EXAM: ULTRASOUND ABDOMEN LIMITED  RIGHT UPPER QUADRANT COMPARISON:  None Available. FINDINGS: Gallbladder: Surgically absent Common bile duct: Diameter: 8.4 mm Liver: No focal lesion identified. Within normal limits in parenchymal echogenicity. Portal vein is patent on color Doppler imaging with normal direction of blood flow towards the liver. Other: Small right pleural effusion. IMPRESSION: 1. Status post cholecystectomy. 2. No focal hepatic lesion. 3. Small right pleural effusion. Electronically Signed   By: Elige Ko M.D.   On: 11/19/2021 10:50   CT Angio Chest/Abd/Pel for Dissection W and/or Wo Contrast  Result Date: 11/18/2021 CLINICAL DATA:  Chest pain or back pain, aortic dissection suspected. EXAM: CT ANGIOGRAPHY CHEST, ABDOMEN AND PELVIS TECHNIQUE: Non-contrast CT of the chest was initially obtained. Multidetector CT imaging through the chest, abdomen and pelvis was performed using the standard protocol during bolus administration of intravenous contrast. Multiplanar reconstructed images and MIPs were obtained and reviewed to evaluate the vascular anatomy. RADIATION DOSE REDUCTION: This exam was performed according to the departmental dose-optimization program which includes automated exposure control, adjustment of the mA and/or kV according to patient size and/or use of iterative reconstruction technique. CONTRAST:  32mL OMNIPAQUE IOHEXOL 350 MG/ML SOLN COMPARISON:  CT abdomen pelvis November 18, 2021. FINDINGS: CTA CHEST FINDINGS Cardiovascular: Noncontrast enhanced CT of the chest does not demonstrate intramural hematoma. Preferential opacification of the thoracic aorta postcontrast administration without no evidence of thoracic aortic aneurysm or dissection. No central pulmonary embolus on this nondedicated study. Normal heart size. No pericardial effusion. Mediastinum/Nodes: No supraclavicular adenopathy. No suspicious thyroid nodule. No pathologically enlarged mediastinal, hilar or axillary lymph nodes. Mild symmetric  esophageal wall thickening. Lungs/Pleura: Trace bilateral pleural effusions. Hypoventilatory change in the dependent lungs. No pneumothorax. Musculoskeletal: No acute osseous abnormality. Review of the MIP images confirms the above findings. CTA ABDOMEN AND PELVIS FINDINGS VASCULAR Aorta: Normal caliber aorta without aneurysm, dissection, vasculitis or significant stenosis.  Celiac: Patent without evidence of aneurysm, dissection, vasculitis or significant stenosis. SMA: Patent without evidence of aneurysm, dissection, vasculitis or significant stenosis. Renals: Renal arteries are patent without evidence of aneurysm, dissection, vasculitis, fibromuscular dysplasia or significant stenosis. IMA: Patent without evidence of aneurysm, dissection, vasculitis or significant stenosis. Inflow: Patent without evidence of aneurysm, dissection, vasculitis or significant stenosis. Veins: No obvious venous abnormality within the limitations of this arterial phase study. Review of the MIP images confirms the above findings. NON-VASCULAR Hepatobiliary: No suspicious hepatic lesion. Similar prominence of the biliary tree post cholecystectomy. Pancreas: No pancreatic ductal dilation or evidence of acute inflammation. Spleen: No splenomegaly. Adrenals/Urinary Tract: Bilateral adrenal glands are within normal limits. No hydronephrosis. Excreted contrast material in the kidneys from prior contrast CT of the abdomen pelvis. 12 mm fluid density left renal lesion is compatible with a cyst and considered benign requiring no independent imaging follow-up. Urinary bladder is unremarkable. Stomach/Bowel: Stomach is unremarkable for degree of distension. No pathologic dilation of small or large bowel. The appendix and terminal ileum appear normal. Moderate volume of formed stool in the colon. No evidence of acute bowel inflammation. Lymphatic: No pathologically enlarged abdominal or pelvic lymph nodes. Reproductive: Status post hysterectomy. No  adnexal masses. Other: No significant abdominopelvic free fluid. Musculoskeletal: No acute osseous abnormality. Review of the MIP images confirms the above findings. IMPRESSION: 1. No evidence of aortic aneurysm or dissection. 2. Trace bilateral pleural effusions. 3. Mild symmetric esophageal wall thickening, suggestive of esophagitis. 4. Moderate volume of formed stool in the colon. Electronically Signed   By: Dahlia Bailiff M.D.   On: 11/18/2021 18:27   DG Chest 1 View  Result Date: 11/18/2021 CLINICAL DATA:  Sore throat since Sunday, decreased oral intake, chest pain EXAM: CHEST  1 VIEW COMPARISON:  05/19/2020 FINDINGS: Single frontal view of the chest demonstrates an unremarkable cardiac silhouette. No airspace disease, effusion, or pneumothorax. No acute bony abnormalities. IMPRESSION: 1. No acute intrathoracic process. Electronically Signed   By: Randa Ngo M.D.   On: 11/18/2021 18:00   CT ABDOMEN PELVIS W CONTRAST  Result Date: 11/18/2021 CLINICAL DATA:  Abdominal pain EXAM: CT ABDOMEN AND PELVIS WITH CONTRAST TECHNIQUE: Multidetector CT imaging of the abdomen and pelvis was performed using the standard protocol following bolus administration of intravenous contrast. RADIATION DOSE REDUCTION: This exam was performed according to the departmental dose-optimization program which includes automated exposure control, adjustment of the mA and/or kV according to patient size and/or use of iterative reconstruction technique. CONTRAST:  24mL OMNIPAQUE IOHEXOL 350 MG/ML SOLN COMPARISON:  05/19/2020 FINDINGS: Lower chest: Minimal bilateral pleural effusions are seen. There is no focal consolidation in the visualized lower lung fields. Hepatobiliary: No focal abnormalities are seen in liver. There is mild prominence of intrahepatic bile ducts. Distal common bile duct in the head of the pancreas measures 10 mm. Surgical clips are seen in gallbladder fossa. Pancreas: No focal abnormalities are seen. Spleen:  Unremarkable. Adrenals/Urinary Tract: Adrenals are unremarkable. There is no hydronephrosis. There are no renal or ureteral stones. There are a few smooth marginated low-density lesions in both kidneys each measuring less than 12 mm. Urinary bladder is unremarkable. Stomach/Bowel: Stomach is not distended. Small bowel loops are not dilated. Appendix is not dilated. There is circumferential wall thickening in short 1.7 cm segment of sigmoid colon in image 81 of series 3. There is no pericolic stranding. Vascular/Lymphatic: Vascular structures are unremarkable. There are subcentimeter nodes in mesentery and retroperitoneum suggesting possible benign reactive hyperplasia. Reproductive: Uterus  is not seen.  There are no adnexal masses. Other: There is no ascites or pneumoperitoneum. Umbilical hernia containing fat is seen. Musculoskeletal: No acute findings are seen. IMPRESSION: No acute findings are seen in abdomen and pelvis. There is no evidence of intestinal obstruction or pneumoperitoneum. Appendix is not dilated. There is no hydronephrosis. There is 1.7 cm short segment circumferential wall thickening in sigmoid colon. This may be due to incomplete distention or inflammatory or neoplastic process. Endoscopic correlation should be considered. Slight prominence of intrahepatic bile ducts may be due to previous cholecystectomy. Minimal bilateral pleural effusions. Bilateral renal cysts. Other findings as described in the body of the report. Electronically Signed   By: Ernie Avena M.D.   On: 11/18/2021 17:22    IMPRESSION: Transaminase elevation in mixed pattern  -Possible DILI exposed to Amoxicillin in outpatient setting? Abdominal pain, etiology undetermined  -Serum lipase elevated, though not to 3x ULN and imaging does not suggest acute pancreatitis  -No biliary etiology on imaging Dysuria, likely contributing to above  -No leukocytosis Oral aphthous ulcerations, unspecified Status post  cholecystectomy  PLAN: -Suspect sigmoid colon under distended on CT imaging, do not suspect colitis, she is not having diarrheal symptoms -Abdominal ultrasound completed as above, suspect bile duct dilatation 2/2 prior cholecystectomy -Transaminase elevation may be related to DILI (amoxicillin prior to admission?) -Will check serologic workup for viral and autoimmune causes of liver enzyme elevation -Ok for PPI therapy for presumed esophagitis on imaging, no plan for endoscopy at this juncture -Ok for diet from GI standpoint -Trend liver enzyme panel -Follow up infectious workup   LOS: 1 day   Liliane Shi, [facility]

## 2021-11-19 NOTE — TOC Progression Note (Signed)
Transition of Care [facility]) - Initial/Assessment Note    Patient Details  Name: RESHANDA LEWEY MRN: [id number] Date of Birth: [birth year]  Transition of [facility]) CM/SW Contact:    Milinda Antis, LCSWA Phone Number: 11/19/2021, 2:59 PM  Clinical Narrative:                  Transition of Care Department [facility]) has reviewed patient.  Patient from home and admitted for Possible acute pyelonephritis. We will continue to monitor patient advancement through interdisciplinary progression rounds.   If new patient transition needs arise, please place a TOC consult.         Patient Goals and CMS Choice        Expected Discharge Plan and Services                                                Prior Living Arrangements/Services                       Activities of Daily Living      Permission Sought/Granted                  Emotional Assessment              Admission diagnosis:  Pyelonephritis [N12] Elevated lipase [R74.8] Acute pyelonephritis [N10] Sepsis, due to unspecified organism, unspecified whether acute organ dysfunction present [facility]) [A41.9] Patient Active Problem List   Diagnosis Date Noted   Acute pyelonephritis 11/18/2021   Tobacco abuse 11/18/2021   Abnormal serum level of lipase 11/18/2021   ABDOMINAL PAIN, UNSPECIFIED SITE 05/16/2008   PCP:  Merryl Hacker, No Pharmacy:   [place], [address] [place] [address] Phone: [phone] Fax: [phone]  [id number] - [place], [place] [address] [place] [address] Phone: [phone] Fax: [phone]     Social Determinants of Health (SDOH) Interventions    Readmission Risk Interventions     No data to display

## 2021-11-19 NOTE — Progress Notes (Signed)
PROGRESS NOTE    [name]  [id number] DOB: 02-21-1975 DOA: 11/18/2021 PCP: Pcp, No   Brief Narrative:  46 year old female with history of tobacco abuse presented with dysuria and hematuria and abdominal pain.  On presentation, UA indicated UTI.  Lipase of 110.  CT angio chest/abdomen/pelvis showed no evidence of aortic aneurysm or dissection but showed mild symmetric esophageal wall thickening suggestive of esophagitis.  CT of abdomen and pelvis with contrast showed no acute findings.  No intestinal obstruction or pneumoperitoneum but showed thickening in sigmoid colon: Due to incomplete distention or inflammatory or neoplastic process along with slight prominence of intrahepatic bile ducts may be due to previous cholecystectomy.  She was started on IV antibiotics, fluids and analgesics.  Assessment & Plan:   Possible acute pyelonephritis -Although CT did not show evidence of pyelonephritis but patient has features of abdominal/flank pain with UTI and leukocytosis -Continue Rocephin.  Follow cultures.  Severe abdominal pain with vomiting Possible acute esophagitis Sigmoid colon thickening -Imaging as above.  Still intermittently nauseous with abdominal pain.  Switch Protonix to IV twice a day.  I have consulted GI.  Follow recommendations.  We will switch her to n.p.o. for now.  We will add Flagyl till GI sees her. -Continue IV fluids. -Add Maalox. -Lipase was only 110: Unclear if this is acute pancreatitis as she did not have findings suggestive of pancreatitis on CT  Elevated LFTs -LFTs normal on presentation but elevated this morning.  Questionable cause.  Repeat a.m. LFTs.  Follow GI recommendations.  Hyponatremia -Switch IV fluids to normal saline.  Repeat a.m. labs  Hypokalemia -Continue supplementation with IV fluids.  Repeat a.m. labs.  Leukocytosis -Resolved  Obesity -Outpatient follow-up   DVT prophylaxis: SCDs Code Status: Full Family Communication:  Daughter at bedside Disposition Plan: Status is: Inpatient Remains inpatient appropriate because: Of severity of illness  Consultants: GI  Procedures: None  Antimicrobials: Rocephin from 11/18/2021 onwards   Subjective: Patient seen and examined at bedside.  Still complains of intermittent abdominal pain with nausea.  She complains of intermittent flank pain.  Had fever this morning.  Objective: Vitals:   11/19/21 0517 11/19/21 0600 11/19/21 0755 11/19/21 0900  BP:  110/69 105/68 109/64  Pulse:  81 70 73  Resp:  18 15 15   Temp: (!) 101 F (38.3 C)  99.1 F (37.3 C)   TempSrc: Oral  Oral   SpO2:  95% 97% 98%  Weight:      Height:        Intake/Output Summary (Last 24 hours) at 11/19/2021 1054 Last data filed at 11/19/2021 0750 Gross per 24 hour  Intake 1939.38 ml  Output --  Net 1939.38 ml   Filed Weights   11/18/21 1304  Weight: 104.3 kg    Examination:  General exam: Appears calm and comfortable.  Currently on room air. Respiratory system: Bilateral decreased breath sounds at bases with scattered crackles Cardiovascular system: S1 & S2 heard, Rate controlled Gastrointestinal system: Abdomen is obese, nondistended, soft and nontender. Normal bowel sounds heard. Genitourinary: Left CVA tenderness present Extremities: No cyanosis, clubbing, edema  Central nervous system: Alert and oriented. No focal neurological deficits. Moving extremities Skin: No rashes, lesions or ulcers Psychiatry: Judgement and insight appear normal. Mood & affect appropriate.     Data Reviewed: I have personally reviewed following labs and imaging studies  CBC: Recent Labs  Lab 11/18/21 1307 11/18/21 2230 11/19/21 0520  WBC 12.6* 8.1 6.7  NEUTROABS 10.4*  --   --  HGB 15.1* 13.4 13.1  HCT 44.0 38.9 36.7  MCV 90.0 89.0 87.6  PLT 311 260 [id number]   Basic Metabolic Panel: Recent Labs  Lab 11/18/21 1307 11/18/21 2230 11/19/21 0520  NA 135  --  133*  K 3.5  --  3.1*  CL 102  --   99  CO2 20*  --  25  GLUCOSE 86  --  110*  BUN 21*  --  9  CREATININE 0.86 0.72 0.71  CALCIUM 8.7*  --  7.7*   GFR: Estimated Creatinine Clearance: 109.9 mL/min (by C-G formula based on SCr of 0.71 mg/dL). Liver Function Tests: Recent Labs  Lab 11/18/21 1307 11/19/21 0520  AST 30 256*  ALT 24 175*  ALKPHOS 57 201*  BILITOT 0.5 1.0  PROT 7.7 6.2*  ALBUMIN 3.8 3.0*   Recent Labs  Lab 11/18/21 1730  LIPASE 110*   No results for input(s): "AMMONIA" in the last 168 hours. Coagulation Profile: No results for input(s): "INR", "PROTIME" in the last 168 hours. Cardiac Enzymes: No results for input(s): "CKTOTAL", "CKMB", "CKMBINDEX", "TROPONINI" in the last 168 hours. BNP (last 3 results) No results for input(s): "PROBNP" in the last 8760 hours. HbA1C: No results for input(s): "HGBA1C" in the last 72 hours. CBG: No results for input(s): "GLUCAP" in the last 168 hours. Lipid Profile: No results for input(s): "CHOL", "HDL", "LDLCALC", "TRIG", "CHOLHDL", "LDLDIRECT" in the last 72 hours. Thyroid Function Tests: No results for input(s): "TSH", "T4TOTAL", "FREET4", "T3FREE", "THYROIDAB" in the last 72 hours. Anemia Panel: No results for input(s): "VITAMINB12", "FOLATE", "FERRITIN", "TIBC", "IRON", "RETICCTPCT" in the last 72 hours. Sepsis Labs: Recent Labs  Lab 11/18/21 2000  LATICACIDVEN 1.1    Recent Results (from the past 240 hour(s))  Group A Strep by PCR     Status: None   Collection Time: 11/18/21  1:00 PM   Specimen: Urine, Clean Catch; Sterile Swab  Result Value Ref Range Status   Group A Strep by PCR NOT DETECTED NOT DETECTED Final    Comment: Performed at [facility], [address], [place], [place] [address]  Resp Panel by RT-PCR (Flu A&B, Covid) Anterior Nasal Swab     Status: None   Collection Time: 11/18/21  3:10 PM   Specimen: Anterior Nasal Swab  Result Value Ref Range Status   SARS Coronavirus 2 by RT PCR NEGATIVE NEGATIVE Final    Comment:  (NOTE) SARS-CoV-2 target nucleic acids are NOT DETECTED.  The SARS-CoV-2 RNA is generally detectable in upper respiratory specimens during the acute phase of infection. The lowest concentration of SARS-CoV-2 viral copies this assay can detect is 138 copies/mL. A negative result does not preclude SARS-Cov-2 infection and should not be used as the sole basis for treatment or other patient management decisions. A negative result may occur with  improper specimen collection/handling, submission of specimen other than nasopharyngeal swab, presence of viral mutation(s) within the areas targeted by this assay, and inadequate number of viral copies(<138 copies/mL). A negative result must be combined with clinical observations, patient history, and epidemiological information. The expected result is Negative.  Fact Sheet for Patients:  EntrepreneurPulse.com.au  Fact Sheet for Healthcare Providers:  IncredibleEmployment.be  This test is no t yet approved or cleared by the [place] FDA and  has been authorized for detection and/or diagnosis of SARS-CoV-2 by FDA under an Emergency Use Authorization (EUA). This EUA will remain  in effect (meaning this test can be used) for the duration of the  COVID-19 declaration under Section 564(b)(1) of the Act, 21 U.S.C.section 360bbb-3(b)(1), unless the authorization is terminated  or revoked sooner.       Influenza A by PCR NEGATIVE NEGATIVE Final   Influenza B by PCR NEGATIVE NEGATIVE Final    Comment: (NOTE) The Xpert Xpress SARS-CoV-2/FLU/RSV plus assay is intended as an aid in the diagnosis of influenza from Nasopharyngeal swab specimens and should not be used as a sole basis for treatment. Nasal washings and aspirates are unacceptable for Xpert Xpress SARS-CoV-2/FLU/RSV testing.  Fact Sheet for Patients: EntrepreneurPulse.com.au  Fact Sheet for Healthcare  Providers: IncredibleEmployment.be  This test is not yet approved or cleared by the [place] FDA and has been authorized for detection and/or diagnosis of SARS-CoV-2 by FDA under an Emergency Use Authorization (EUA). This EUA will remain in effect (meaning this test can be used) for the duration of the COVID-19 declaration under Section 564(b)(1) of the Act, 21 U.S.C. section 360bbb-3(b)(1), unless the authorization is terminated or revoked.  Performed at [facility], [place] [address] [place], [place] [address]   Blood culture (routine x 2)     Status: None (Preliminary result)   Collection Time: 11/18/21  8:00 PM   Specimen: BLOOD RIGHT HAND  Result Value Ref Range Status   Specimen Description BLOOD RIGHT HAND  Final   Special Requests   Final    BOTTLES DRAWN AEROBIC AND ANAEROBIC Blood Culture results may not be optimal due to an inadequate volume of blood received in culture bottles   Culture   Final    NO GROWTH < 12 HOURS Performed at [facility], [place] [address], [place], [place] [address]    Report Status PENDING  Incomplete  Blood culture (routine x 2)     Status: None (Preliminary result)   Collection Time: 11/18/21  8:00 PM   Specimen: BLOOD RIGHT HAND  Result Value Ref Range Status   Specimen Description BLOOD RIGHT HAND  Final   Special Requests   Final    BOTTLES DRAWN AEROBIC AND ANAEROBIC Blood Culture results may not be optimal due to an inadequate volume of blood received in culture bottles   Culture   Final    NO GROWTH < 12 HOURS Performed at [facility], [place] [address] [place], [place] [address]    Report Status PENDING  Incomplete         Radiology Studies: US Abdomen Limited RUQ (LIVER/GB)  Result Date: 11/19/2021 CLINICAL DATA:  Elevated liver enzymes EXAM: ULTRASOUND ABDOMEN LIMITED RIGHT UPPER QUADRANT COMPARISON:  None Available. FINDINGS: Gallbladder: Surgically absent Common bile duct: Diameter:  8.4 mm Liver: No focal lesion identified. Within normal limits in parenchymal echogenicity. Portal vein is patent on color Doppler imaging with normal direction of blood flow towards the liver. Other: Small right pleural effusion. IMPRESSION: 1. Status post cholecystectomy. 2. No focal hepatic lesion. 3. Small right pleural effusion. Electronically Signed   By: Kathreen Devoid M.D.   On: 11/19/2021 10:50   CT Angio Chest/Abd/Pel for Dissection W and/or Wo Contrast  Result Date: 11/18/2021 CLINICAL DATA:  Chest pain or back pain, aortic dissection suspected. EXAM: CT ANGIOGRAPHY CHEST, ABDOMEN AND PELVIS TECHNIQUE: Non-contrast CT of the chest was initially obtained. Multidetector CT imaging through the chest, abdomen and pelvis was performed using the standard protocol during bolus administration of intravenous contrast. Multiplanar reconstructed images and MIPs were obtained and reviewed to evaluate the vascular anatomy. RADIATION DOSE REDUCTION: This exam was  performed according to the departmental dose-optimization program which includes automated exposure control, adjustment of the mA and/or kV according to patient size and/or use of iterative reconstruction technique. CONTRAST:  67mL OMNIPAQUE IOHEXOL 350 MG/ML SOLN COMPARISON:  CT abdomen pelvis November 18, 2021. FINDINGS: CTA CHEST FINDINGS Cardiovascular: Noncontrast enhanced CT of the chest does not demonstrate intramural hematoma. Preferential opacification of the thoracic aorta postcontrast administration without no evidence of thoracic aortic aneurysm or dissection. No central pulmonary embolus on this nondedicated study. Normal heart size. No pericardial effusion. Mediastinum/Nodes: No supraclavicular adenopathy. No suspicious thyroid nodule. No pathologically enlarged mediastinal, hilar or axillary lymph nodes. Mild symmetric esophageal wall thickening. Lungs/Pleura: Trace bilateral pleural effusions. Hypoventilatory change in the dependent lungs. No  pneumothorax. Musculoskeletal: No acute osseous abnormality. Review of the MIP images confirms the above findings. CTA ABDOMEN AND PELVIS FINDINGS VASCULAR Aorta: Normal caliber aorta without aneurysm, dissection, vasculitis or significant stenosis. Celiac: Patent without evidence of aneurysm, dissection, vasculitis or significant stenosis. SMA: Patent without evidence of aneurysm, dissection, vasculitis or significant stenosis. Renals: Renal arteries are patent without evidence of aneurysm, dissection, vasculitis, fibromuscular dysplasia or significant stenosis. IMA: Patent without evidence of aneurysm, dissection, vasculitis or significant stenosis. Inflow: Patent without evidence of aneurysm, dissection, vasculitis or significant stenosis. Veins: No obvious venous abnormality within the limitations of this arterial phase study. Review of the MIP images confirms the above findings. NON-VASCULAR Hepatobiliary: No suspicious hepatic lesion. Similar prominence of the biliary tree post cholecystectomy. Pancreas: No pancreatic ductal dilation or evidence of acute inflammation. Spleen: No splenomegaly. Adrenals/Urinary Tract: Bilateral adrenal glands are within normal limits. No hydronephrosis. Excreted contrast material in the kidneys from prior contrast CT of the abdomen pelvis. 12 mm fluid density left renal lesion is compatible with a cyst and considered benign requiring no independent imaging follow-up. Urinary bladder is unremarkable. Stomach/Bowel: Stomach is unremarkable for degree of distension. No pathologic dilation of small or large bowel. The appendix and terminal ileum appear normal. Moderate volume of formed stool in the colon. No evidence of acute bowel inflammation. Lymphatic: No pathologically enlarged abdominal or pelvic lymph nodes. Reproductive: Status post hysterectomy. No adnexal masses. Other: No significant abdominopelvic free fluid. Musculoskeletal: No acute osseous abnormality. Review of the  MIP images confirms the above findings. IMPRESSION: 1. No evidence of aortic aneurysm or dissection. 2. Trace bilateral pleural effusions. 3. Mild symmetric esophageal wall thickening, suggestive of esophagitis. 4. Moderate volume of formed stool in the colon. Electronically Signed   By: Dahlia Bailiff M.D.   On: 11/18/2021 18:27   DG Chest 1 View  Result Date: 11/18/2021 CLINICAL DATA:  Sore throat since Sunday, decreased oral intake, chest pain EXAM: CHEST  1 VIEW COMPARISON:  05/19/2020 FINDINGS: Single frontal view of the chest demonstrates an unremarkable cardiac silhouette. No airspace disease, effusion, or pneumothorax. No acute bony abnormalities. IMPRESSION: 1. No acute intrathoracic process. Electronically Signed   By: Randa Ngo M.D.   On: 11/18/2021 18:00   CT ABDOMEN PELVIS W CONTRAST  Result Date: 11/18/2021 CLINICAL DATA:  Abdominal pain EXAM: CT ABDOMEN AND PELVIS WITH CONTRAST TECHNIQUE: Multidetector CT imaging of the abdomen and pelvis was performed using the standard protocol following bolus administration of intravenous contrast. RADIATION DOSE REDUCTION: This exam was performed according to the departmental dose-optimization program which includes automated exposure control, adjustment of the mA and/or kV according to patient size and/or use of iterative reconstruction technique. CONTRAST:  76mL OMNIPAQUE IOHEXOL 350 MG/ML SOLN COMPARISON:  05/19/2020 FINDINGS: Lower chest:  Minimal bilateral pleural effusions are seen. There is no focal consolidation in the visualized lower lung fields. Hepatobiliary: No focal abnormalities are seen in liver. There is mild prominence of intrahepatic bile ducts. Distal common bile duct in the head of the pancreas measures 10 mm. Surgical clips are seen in gallbladder fossa. Pancreas: No focal abnormalities are seen. Spleen: Unremarkable. Adrenals/Urinary Tract: Adrenals are unremarkable. There is no hydronephrosis. There are no renal or ureteral  stones. There are a few smooth marginated low-density lesions in both kidneys each measuring less than 12 mm. Urinary bladder is unremarkable. Stomach/Bowel: Stomach is not distended. Small bowel loops are not dilated. Appendix is not dilated. There is circumferential wall thickening in short 1.7 cm segment of sigmoid colon in image 81 of series 3. There is no pericolic stranding. Vascular/Lymphatic: Vascular structures are unremarkable. There are subcentimeter nodes in mesentery and retroperitoneum suggesting possible benign reactive hyperplasia. Reproductive: Uterus is not seen.  There are no adnexal masses. Other: There is no ascites or pneumoperitoneum. Umbilical hernia containing fat is seen. Musculoskeletal: No acute findings are seen. IMPRESSION: No acute findings are seen in abdomen and pelvis. There is no evidence of intestinal obstruction or pneumoperitoneum. Appendix is not dilated. There is no hydronephrosis. There is 1.7 cm short segment circumferential wall thickening in sigmoid colon. This may be due to incomplete distention or inflammatory or neoplastic process. Endoscopic correlation should be considered. Slight prominence of intrahepatic bile ducts may be due to previous cholecystectomy. Minimal bilateral pleural effusions. Bilateral renal cysts. Other findings as described in the body of the report. Electronically Signed   By: Elmer Picker M.D.   On: 11/18/2021 17:22        Scheduled Meds:  enoxaparin (LOVENOX) injection  50 mg Subcutaneous Q24H   pantoprazole (PROTONIX) IV  40 mg Intravenous Q12H   Continuous Infusions:  0.9 % NaCl with KCl 40 mEq / L 125 mL/hr at 11/19/21 0829   cefTRIAXone (ROCEPHIN)  IV            Aline August, MD [facility] 11/19/2021, 10:54 AM

## 2021-11-20 DIAGNOSIS — K209 Esophagitis, unspecified without bleeding: Secondary | ICD-10-CM | POA: Diagnosis not present

## 2021-11-20 DIAGNOSIS — N10 Acute tubulo-interstitial nephritis: Secondary | ICD-10-CM | POA: Diagnosis not present

## 2021-11-20 DIAGNOSIS — K639 Disease of intestine, unspecified: Secondary | ICD-10-CM

## 2021-11-20 LAB — COMPREHENSIVE METABOLIC PANEL
ALT: 108 U/L — ABNORMAL HIGH (ref 0–44)
AST: 76 U/L — ABNORMAL HIGH (ref 15–41)
Albumin: 3 g/dL — ABNORMAL LOW (ref 3.5–5.0)
Alkaline Phosphatase: 160 U/L — ABNORMAL HIGH (ref 38–126)
Anion gap: 5 (ref 5–15)
BUN: 6 mg/dL (ref 6–20)
CO2: 27 mmol/L (ref 22–32)
Calcium: 7.6 mg/dL — ABNORMAL LOW (ref 8.9–10.3)
Chloride: 103 mmol/L (ref 98–111)
Creatinine, Ser: 0.74 mg/dL (ref 0.44–1.00)
GFR, Estimated: >60 mL/min (ref >60)
Glucose, Bld: 94 mg/dL (ref 70–99)
Potassium: 3.7 mmol/L (ref 3.5–5.1)
Sodium: 135 mmol/L (ref 135–145)
Total Bilirubin: 0.5 mg/dL (ref 0.3–1.2)
Total Protein: 6.2 g/dL — ABNORMAL LOW (ref 6.5–8.1)

## 2021-11-20 LAB — CBC WITH DIFFERENTIAL/PLATELET
Abs Immature Granulocytes: 0.04 10*3/uL (ref 0.00–0.07)
Basophils Absolute: 0 10*3/uL (ref 0.0–0.1)
Basophils Relative: 0 %
Eosinophils Absolute: 0.1 10*3/uL (ref 0.0–0.5)
Eosinophils Relative: 2 %
HCT: 37.3 % (ref 36.0–46.0)
Hemoglobin: 12.9 g/dL (ref 12.0–15.0)
Immature Granulocytes: 1 %
Lymphocytes Relative: 29 %
Lymphs Abs: 1.7 10*3/uL (ref 0.7–4.0)
MCH: 31 pg (ref 26.0–34.0)
MCHC: 34.6 g/dL (ref 30.0–36.0)
MCV: 89.7 fL (ref 80.0–100.0)
Monocytes Absolute: 0.5 10*3/uL (ref 0.1–1.0)
Monocytes Relative: 8 %
Neutro Abs: 3.5 10*3/uL (ref 1.7–7.7)
Neutrophils Relative %: 60 %
Platelets: 237 10*3/uL (ref 150–400)
RBC: 4.16 MIL/uL (ref 3.87–5.11)
RDW: 12.4 % (ref 11.5–15.5)
WBC: 5.9 10*3/uL (ref 4.0–10.5)
nRBC: 0 % (ref 0.0–0.2)

## 2021-11-20 LAB — MAGNESIUM: Magnesium: 1.9 mg/dL (ref 1.7–2.4)

## 2021-11-20 LAB — ANA: Anti Nuclear Antibody (ANA): NEGATIVE

## 2021-11-20 MED ORDER — SODIUM CHLORIDE 0.9 % IV SOLN
2.0000 g | Freq: Three times a day (TID) | INTRAVENOUS | Status: DC
  Administered 2021-11-20: 2 g via INTRAVENOUS
  Filled 2021-11-20 (×2): qty 12.5

## 2021-11-20 MED ORDER — SUCRALFATE 1 GM/10ML PO SUSP
1.0000 g | Freq: Three times a day (TID) | ORAL | Status: DC
  Administered 2021-11-20 – 2021-11-23 (×12): 1 g via ORAL
  Filled 2021-11-20 (×12): qty 10

## 2021-11-20 MED ORDER — SODIUM CHLORIDE 0.9 % IV SOLN
2.0000 g | Freq: Three times a day (TID) | INTRAVENOUS | Status: DC
  Administered 2021-11-20 – 2021-11-23 (×9): 2 g via INTRAVENOUS
  Filled 2021-11-20 (×8): qty 12.5

## 2021-11-20 MED ORDER — MAGIC MOUTHWASH
5.0000 mL | Freq: Four times a day (QID) | ORAL | Status: DC | PRN
  Administered 2021-11-20: 5 mL via ORAL
  Filled 2021-11-20 (×2): qty 5

## 2021-11-20 NOTE — Progress Notes (Signed)
PROGRESS NOTE    [name]  JJK:[id number] DOB: Sep 26, 1975 DOA: 11/18/2021 PCP: Pcp, No   Brief Narrative:  46 year old female with history of tobacco abuse presented with dysuria and hematuria and abdominal pain.  On presentation, UA indicated UTI.  Lipase of 110.  CT angio chest/abdomen/pelvis showed no evidence of aortic aneurysm or dissection but showed mild symmetric esophageal wall thickening suggestive of esophagitis.  CT of abdomen and pelvis with contrast showed no acute findings.  No intestinal obstruction or pneumoperitoneum but showed thickening in sigmoid colon: Due to incomplete distention or inflammatory or neoplastic process along with slight prominence of intrahepatic bile ducts may be due to previous cholecystectomy.  She was started on IV antibiotics, fluids and analgesics.  GI was consulted.  Assessment & Plan:   Possible acute pyelonephritis -Although CT did not show evidence of pyelonephritis but patient has features of abdominal/flank pain with UTI and leukocytosis - Blood cultures negative so far.  Urine culture not sent yet. -Still having fever, Tmax of 103.1 over the last 24 hours.  Switch Rocephin to cefepime.  Severe abdominal pain with vomiting Possible acute esophagitis Sigmoid colon thickening -Imaging as above.  Continue IV Protonix and as needed oral Maalox.  Continue Flagyl.  -GI following -Advance diet as tolerated. -Continue IV fluids. -Lipase was only 110: Unclear if this is acute pancreatitis as she did not have findings suggestive of pancreatitis on CT.  Repeat lipase on 11/19/2021 was normal.  Elevated LFTs -LFTs normal on presentation but elevated on 11/19/2021.  Questionable cause.  LFT improving.  Repeat a.m. LFTs.  Right upper quadrant ultrasound was negative for any focal hepatic lesion..  Hyponatremia -Improved.  Hypokalemia -Improved  Leukocytosis -Resolved  Obesity -Outpatient follow-up   DVT prophylaxis: SCDs Code  Status: Full Family Communication: Daughter at bedside Disposition Plan: Status is: Inpatient Remains inpatient appropriate because: Of severity of illness  Consultants: GI  Procedures: None  Antimicrobials:  Anti-infectives (From admission, onward)    Start     Dose/Rate Route Frequency Ordered Stop   11/19/21 1200  metroNIDAZOLE (FLAGYL) IVPB 500 mg        500 mg 100 mL/hr over 60 Minutes Intravenous Every 12 hours 11/19/21 1107     11/19/21 1000  cefTRIAXone (ROCEPHIN) 1 g in sodium chloride 0.9 % 100 mL IVPB        1 g 200 mL/hr over 30 Minutes Intravenous Every 24 hours 11/18/21 2026     11/18/21 2030  cefTRIAXone (ROCEPHIN) 1 g in sodium chloride 0.9 % 100 mL IVPB  Status:  Discontinued        1 g 200 mL/hr over 30 Minutes Intravenous Every 24 hours 11/18/21 2016 11/18/21 2026   11/18/21 1600  cefTRIAXone (ROCEPHIN) 2 g in sodium chloride 0.9 % 100 mL IVPB        2 g 200 mL/hr over 30 Minutes Intravenous  Once 11/18/21 1548 11/18/21 1652         Subjective: Patient seen and examined at bedside.  Still complains of intermittent fever and flank pain.  Still has some intermittent nausea with mouth pain.  Does not feel better.  No vomiting, chest pain or worsening shortness of breath reported. Objective: Vitals:   11/19/21 2048 11/19/21 2238 11/20/21 0123 11/20/21 0451  BP: 119/62   (!) 143/87  Pulse: 85   94  Resp: 18   18  Temp: (!) 103.1 F (39.5 C) (!) 100.7 F (38.2 C) 99.1 F (37.3 C) (!) 100.8  F (38.2 C)  TempSrc: Oral   Oral  SpO2: 90%   94%  Weight:      Height:        Intake/Output Summary (Last 24 hours) at 11/20/2021 0803 Last data filed at 11/20/2021 0601 Gross per 24 hour  Intake 2749.51 ml  Output 0 ml  Net 2749.51 ml    Filed Weights   11/18/21 1304  Weight: 104.3 kg    Examination:  General: On room air.  No distress ENT/neck: No thyromegaly.  JVD is not elevated  respiratory: Decreased breath sounds at bases bilaterally with some  crackles; no wheezing  CVS: S1-S2 heard, rate controlled currently Abdominal: Soft, epigastric tenderness present, slightly distended; no organomegaly, bowel sounds are heard Genitourinary: Left CVA tenderness present Extremities: No edema; no cyanosis  CNS: Awake and alert.  No focal neurologic deficit.  Moves extremities Lymph: No obvious lymphadenopathy Skin: No obvious ecchymosis/lesions  psych: Intermittently crying.  No signs of agitation.   Musculoskeletal: No obvious joint swelling/deformity    Data Reviewed: I have personally reviewed following labs and imaging studies  CBC: Recent Labs  Lab 11/18/21 1307 11/18/21 2230 11/19/21 0520 11/20/21 0335  WBC 12.6* 8.1 6.7 5.9  NEUTROABS 10.4*  --   --  3.5  HGB 15.1* 13.4 13.1 12.9  HCT 44.0 38.9 36.7 37.3  MCV 90.0 89.0 87.6 89.7  PLT 311 260 252 237    Basic Metabolic Panel: Recent Labs  Lab 11/18/21 1307 11/18/21 2230 11/19/21 0520 11/20/21 0335  NA 135  --  133* 135  K 3.5  --  3.1* 3.7  CL 102  --  99 103  CO2 20*  --  25 27  GLUCOSE 86  --  110* 94  BUN 21*  --  9 6  CREATININE 0.86 0.72 0.71 0.74  CALCIUM 8.7*  --  7.7* 7.6*  MG  --   --   --  1.9    GFR: Estimated Creatinine Clearance: 109.9 mL/min (by C-G formula based on SCr of 0.74 mg/dL). Liver Function Tests: Recent Labs  Lab 11/18/21 1307 11/19/21 0520 11/20/21 0335  AST 30 256* 76*  ALT 24 175* 108*  ALKPHOS 57 201* 160*  BILITOT 0.5 1.0 0.5  PROT 7.7 6.2* 6.2*  ALBUMIN 3.8 3.0* 3.0*    Recent Labs  Lab 11/18/21 1730 11/19/21 1517  LIPASE 110* 43    No results for input(s): "AMMONIA" in the last 168 hours. Coagulation Profile: No results for input(s): "INR", "PROTIME" in the last 168 hours. Cardiac Enzymes: No results for input(s): "CKTOTAL", "CKMB", "CKMBINDEX", "TROPONINI" in the last 168 hours. BNP (last 3 results) No results for input(s): "PROBNP" in the last 8760 hours. HbA1C: No results for input(s): "HGBA1C" in the  last 72 hours. CBG: No results for input(s): "GLUCAP" in the last 168 hours. Lipid Profile: No results for input(s): "CHOL", "HDL", "LDLCALC", "TRIG", "CHOLHDL", "LDLDIRECT" in the last 72 hours. Thyroid Function Tests: No results for input(s): "TSH", "T4TOTAL", "FREET4", "T3FREE", "THYROIDAB" in the last 72 hours. Anemia Panel: No results for input(s): "VITAMINB12", "FOLATE", "FERRITIN", "TIBC", "IRON", "RETICCTPCT" in the last 72 hours. Sepsis Labs: Recent Labs  Lab 11/18/21 2000  LATICACIDVEN 1.1     Recent Results (from the past 240 hour(s))  Group A Strep by PCR     Status: None   Collection Time: 11/18/21  1:00 PM   Specimen: Urine, Clean Catch; Sterile Swab  Result Value Ref Range Status   Group A Strep  by PCR NOT DETECTED NOT DETECTED Final    Comment: Performed at [facility], [place] [address], [place], [place] [address]  Resp Panel by RT-PCR (Flu A&B, Covid) Anterior Nasal Swab     Status: None   Collection Time: 11/18/21  3:10 PM   Specimen: Anterior Nasal Swab  Result Value Ref Range Status   SARS Coronavirus 2 by RT PCR NEGATIVE NEGATIVE Final    Comment: (NOTE) SARS-CoV-2 target nucleic acids are NOT DETECTED.  The SARS-CoV-2 RNA is generally detectable in upper respiratory specimens during the acute phase of infection. The lowest concentration of SARS-CoV-2 viral copies this assay can detect is 138 copies/mL. A negative result does not preclude SARS-Cov-2 infection and should not be used as the sole basis for treatment or other patient management decisions. A negative result may occur with  improper specimen collection/handling, submission of specimen other than nasopharyngeal swab, presence of viral mutation(s) within the areas targeted by this assay, and inadequate number of viral copies(<138 copies/mL). A negative result must be combined with clinical observations, patient history, and epidemiological information. The expected result is  Negative.  Fact Sheet for Patients:  EntrepreneurPulse.com.au  Fact Sheet for Healthcare Providers:  IncredibleEmployment.be  This test is no t yet approved or cleared by the [place] FDA and  has been authorized for detection and/or diagnosis of SARS-CoV-2 by FDA under an Emergency Use Authorization (EUA). This EUA will remain  in effect (meaning this test can be used) for the duration of the COVID-19 declaration under Section 564(b)(1) of the Act, 21 U.S.C.section 360bbb-3(b)(1), unless the authorization is terminated  or revoked sooner.       Influenza A by PCR NEGATIVE NEGATIVE Final   Influenza B by PCR NEGATIVE NEGATIVE Final    Comment: (NOTE) The Xpert Xpress SARS-CoV-2/FLU/RSV plus assay is intended as an aid in the diagnosis of influenza from Nasopharyngeal swab specimens and should not be used as a sole basis for treatment. Nasal washings and aspirates are unacceptable for Xpert Xpress SARS-CoV-2/FLU/RSV testing.  Fact Sheet for Patients: EntrepreneurPulse.com.au  Fact Sheet for Healthcare Providers: IncredibleEmployment.be  This test is not yet approved or cleared by the [place] FDA and has been authorized for detection and/or diagnosis of SARS-CoV-2 by FDA under an Emergency Use Authorization (EUA). This EUA will remain in effect (meaning this test can be used) for the duration of the COVID-19 declaration under Section 564(b)(1) of the Act, 21 U.S.C. section 360bbb-3(b)(1), unless the authorization is terminated or revoked.  Performed at [facility], [place] [address] [place], [place] [address]   Blood culture (routine x 2)     Status: None (Preliminary result)   Collection Time: 11/18/21  8:00 PM   Specimen: BLOOD RIGHT HAND  Result Value Ref Range Status   Specimen Description BLOOD RIGHT HAND  Final   Special Requests   Final    BOTTLES DRAWN AEROBIC AND ANAEROBIC  Blood Culture results may not be optimal due to an inadequate volume of blood received in culture bottles   Culture   Final    NO GROWTH 2 DAYS Performed at [facility], [place] [address] [place], [place] [address]    Report Status PENDING  Incomplete  Blood culture (routine x 2)     Status: None (Preliminary result)   Collection Time: 11/18/21  8:00 PM   Specimen: BLOOD RIGHT HAND  Result Value Ref Range Status   Specimen Description BLOOD RIGHT HAND  Final  Special Requests   Final    BOTTLES DRAWN AEROBIC AND ANAEROBIC Blood Culture results may not be optimal due to an inadequate volume of blood received in culture bottles   Culture   Final    NO GROWTH 2 DAYS Performed at [facility], [address] [place], [place] [address]    Report Status PENDING  Incomplete         Radiology Studies: US Abdomen Limited RUQ (LIVER/GB)  Result Date: 11/19/2021 CLINICAL DATA:  Elevated liver enzymes EXAM: ULTRASOUND ABDOMEN LIMITED RIGHT UPPER QUADRANT COMPARISON:  None Available. FINDINGS: Gallbladder: Surgically absent Common bile duct: Diameter: 8.4 mm Liver: No focal lesion identified. Within normal limits in parenchymal echogenicity. Portal vein is patent on color Doppler imaging with normal direction of blood flow towards the liver. Other: Small right pleural effusion. IMPRESSION: 1. Status post cholecystectomy. 2. No focal hepatic lesion. 3. Small right pleural effusion. Electronically Signed   By: Elige KoHetal  Patel M.D.   On: 11/19/2021 10:50   CT Angio Chest/Abd/Pel for Dissection W and/or Wo Contrast  Result Date: 11/18/2021 CLINICAL DATA:  Chest pain or back pain, aortic dissection suspected. EXAM: CT ANGIOGRAPHY CHEST, ABDOMEN AND PELVIS TECHNIQUE: Non-contrast CT of the chest was initially obtained. Multidetector CT imaging through the chest, abdomen and pelvis was performed using the standard protocol during bolus administration of intravenous contrast. Multiplanar  reconstructed images and MIPs were obtained and reviewed to evaluate the vascular anatomy. RADIATION DOSE REDUCTION: This exam was performed according to the departmental dose-optimization program which includes automated exposure control, adjustment of the mA and/or kV according to patient size and/or use of iterative reconstruction technique. CONTRAST:  80mL OMNIPAQUE IOHEXOL 350 MG/ML SOLN COMPARISON:  CT abdomen pelvis November 18, 2021. FINDINGS: CTA CHEST FINDINGS Cardiovascular: Noncontrast enhanced CT of the chest does not demonstrate intramural hematoma. Preferential opacification of the thoracic aorta postcontrast administration without no evidence of thoracic aortic aneurysm or dissection. No central pulmonary embolus on this nondedicated study. Normal heart size. No pericardial effusion. Mediastinum/Nodes: No supraclavicular adenopathy. No suspicious thyroid nodule. No pathologically enlarged mediastinal, hilar or axillary lymph nodes. Mild symmetric esophageal wall thickening. Lungs/Pleura: Trace bilateral pleural effusions. Hypoventilatory change in the dependent lungs. No pneumothorax. Musculoskeletal: No acute osseous abnormality. Review of the MIP images confirms the above findings. CTA ABDOMEN AND PELVIS FINDINGS VASCULAR Aorta: Normal caliber aorta without aneurysm, dissection, vasculitis or significant stenosis. Celiac: Patent without evidence of aneurysm, dissection, vasculitis or significant stenosis. SMA: Patent without evidence of aneurysm, dissection, vasculitis or significant stenosis. Renals: Renal arteries are patent without evidence of aneurysm, dissection, vasculitis, fibromuscular dysplasia or significant stenosis. IMA: Patent without evidence of aneurysm, dissection, vasculitis or significant stenosis. Inflow: Patent without evidence of aneurysm, dissection, vasculitis or significant stenosis. Veins: No obvious venous abnormality within the limitations of this arterial phase study.  Review of the MIP images confirms the above findings. NON-VASCULAR Hepatobiliary: No suspicious hepatic lesion. Similar prominence of the biliary tree post cholecystectomy. Pancreas: No pancreatic ductal dilation or evidence of acute inflammation. Spleen: No splenomegaly. Adrenals/Urinary Tract: Bilateral adrenal glands are within normal limits. No hydronephrosis. Excreted contrast material in the kidneys from prior contrast CT of the abdomen pelvis. 12 mm fluid density left renal lesion is compatible with a cyst and considered benign requiring no independent imaging follow-up. Urinary bladder is unremarkable. Stomach/Bowel: Stomach is unremarkable for degree of distension. No pathologic dilation of small or large bowel. The appendix and terminal ileum appear normal. Moderate volume of formed  stool in the colon. No evidence of acute bowel inflammation. Lymphatic: No pathologically enlarged abdominal or pelvic lymph nodes. Reproductive: Status post hysterectomy. No adnexal masses. Other: No significant abdominopelvic free fluid. Musculoskeletal: No acute osseous abnormality. Review of the MIP images confirms the above findings. IMPRESSION: 1. No evidence of aortic aneurysm or dissection. 2. Trace bilateral pleural effusions. 3. Mild symmetric esophageal wall thickening, suggestive of esophagitis. 4. Moderate volume of formed stool in the colon. Electronically Signed   By: Maudry Mayhew M.D.   On: 11/18/2021 18:27   DG Chest 1 View  Result Date: 11/18/2021 CLINICAL DATA:  Sore throat since Sunday, decreased oral intake, chest pain EXAM: CHEST  1 VIEW COMPARISON:  05/19/2020 FINDINGS: Single frontal view of the chest demonstrates an unremarkable cardiac silhouette. No airspace disease, effusion, or pneumothorax. No acute bony abnormalities. IMPRESSION: 1. No acute intrathoracic process. Electronically Signed   By: Sharlet Salina M.D.   On: 11/18/2021 18:00   CT ABDOMEN PELVIS W CONTRAST  Result Date:  11/18/2021 CLINICAL DATA:  Abdominal pain EXAM: CT ABDOMEN AND PELVIS WITH CONTRAST TECHNIQUE: Multidetector CT imaging of the abdomen and pelvis was performed using the standard protocol following bolus administration of intravenous contrast. RADIATION DOSE REDUCTION: This exam was performed according to the departmental dose-optimization program which includes automated exposure control, adjustment of the mA and/or kV according to patient size and/or use of iterative reconstruction technique. CONTRAST:  88mL OMNIPAQUE IOHEXOL 350 MG/ML SOLN COMPARISON:  05/19/2020 FINDINGS: Lower chest: Minimal bilateral pleural effusions are seen. There is no focal consolidation in the visualized lower lung fields. Hepatobiliary: No focal abnormalities are seen in liver. There is mild prominence of intrahepatic bile ducts. Distal common bile duct in the head of the pancreas measures 10 mm. Surgical clips are seen in gallbladder fossa. Pancreas: No focal abnormalities are seen. Spleen: Unremarkable. Adrenals/Urinary Tract: Adrenals are unremarkable. There is no hydronephrosis. There are no renal or ureteral stones. There are a few smooth marginated low-density lesions in both kidneys each measuring less than 12 mm. Urinary bladder is unremarkable. Stomach/Bowel: Stomach is not distended. Small bowel loops are not dilated. Appendix is not dilated. There is circumferential wall thickening in short 1.7 cm segment of sigmoid colon in image 81 of series 3. There is no pericolic stranding. Vascular/Lymphatic: Vascular structures are unremarkable. There are subcentimeter nodes in mesentery and retroperitoneum suggesting possible benign reactive hyperplasia. Reproductive: Uterus is not seen.  There are no adnexal masses. Other: There is no ascites or pneumoperitoneum. Umbilical hernia containing fat is seen. Musculoskeletal: No acute findings are seen. IMPRESSION: No acute findings are seen in abdomen and pelvis. There is no evidence of  intestinal obstruction or pneumoperitoneum. Appendix is not dilated. There is no hydronephrosis. There is 1.7 cm short segment circumferential wall thickening in sigmoid colon. This may be due to incomplete distention or inflammatory or neoplastic process. Endoscopic correlation should be considered. Slight prominence of intrahepatic bile ducts may be due to previous cholecystectomy. Minimal bilateral pleural effusions. Bilateral renal cysts. Other findings as described in the body of the report. Electronically Signed   By: Ernie Avena M.D.   On: 11/18/2021 17:22        Scheduled Meds:  enoxaparin (LOVENOX) injection  50 mg Subcutaneous Q24H   pantoprazole (PROTONIX) IV  40 mg Intravenous Q12H   Continuous Infusions:  0.9 % NaCl with KCl 40 mEq / L 125 mL/hr at 11/20/21 0056   cefTRIAXone (ROCEPHIN)  IV Stopped (11/19/21 1316)  metronidazole 500 mg (11/20/21 0103)          Glade Lloyd, MD [facility] 11/20/2021, 8:03 AM

## 2021-11-20 NOTE — Progress Notes (Signed)
Subjective: Tearful, very concerned that oral lesions are not improving, complains of ulcers in her tongue and roof of mouth.  Objective: Vital signs in last 24 hours: Temp:  [98.2 F (36.8 C)-103.1 F (39.5 C)] 98.8 F (37.1 C) (11/04 0848) Pulse Rate:  [69-94] 69 (11/04 0848) Resp:  [18-19] 18 (11/04 0848) BP: (116-143)/(62-87) 128/80 (11/04 0848) SpO2:  [90 %-97 %] 97 % (11/04 0848) Weight change:     PE: Tearful, anxious GENERAL: Able to speak in full sentences, hoarse voice ABDOMEN: Soft, nondistended, nontender, normoactive bowel sounds EXTREMITIES: No deformity, no edema  Lab Results: Results for orders placed or performed during the hospital encounter of 11/18/21 (from the past 48 hour(s))  Urinalysis, Routine w reflex microscopic Urine, Clean Catch     Status: Abnormal   Collection Time: 11/18/21  3:36 PM  Result Value Ref Range   Color, Urine YELLOW YELLOW   APPearance CLOUDY (A) CLEAR   Specific Gravity, Urine >1.030 (H) 1.005 - 1.030   pH 6.0 5.0 - 8.0   Glucose, UA NEGATIVE NEGATIVE mg/dL   Hgb urine dipstick MODERATE (A) NEGATIVE   Bilirubin Urine SMALL (A) NEGATIVE   Ketones, ur >80 (A) NEGATIVE mg/dL   Protein, ur 100 (A) NEGATIVE mg/dL   Nitrite NEGATIVE NEGATIVE   Leukocytes,Ua MODERATE (A) NEGATIVE    Comment: Performed at [facility], [address] [place], [place] [address]  Urinalysis, Microscopic (reflex)     Status: Abnormal   Collection Time: 11/18/21  3:36 PM  Result Value Ref Range   RBC / HPF 0-5 0 - 5 RBC/hpf   WBC, UA 11-20 0 - 5 WBC/hpf   Bacteria, UA MANY (A) NONE SEEN   Squamous Epithelial / LPF 6-10 0 - 5   Mucus PRESENT     Comment: Performed at [facility], [place] [address], [place], [place] [address]  Troponin I (High Sensitivity)     Status: None   Collection Time: 11/18/21  5:30 PM  Result Value Ref Range   Troponin I (High Sensitivity) 3 <18 ng/L    Comment: (NOTE) Elevated high sensitivity troponin I (hsTnI)  values and significant  changes across serial measurements may suggest ACS but many other  chronic and acute conditions are known to elevate hsTnI results.  Refer to the "Links" section for chest pain algorithms and additional  guidance. Performed at [facility], [place] [address], [place], [place] [address]   Lipase, blood     Status: Abnormal   Collection Time: 11/18/21  5:30 PM  Result Value Ref Range   Lipase 110 (H) 11 - 51 U/L    Comment: Performed at [facility] [address] [place], [place] [address]  Troponin I (High Sensitivity)     Status: None   Collection Time: 11/18/21  8:00 PM  Result Value Ref Range   Troponin I (High Sensitivity) 5 <18 ng/L    Comment: (NOTE) Elevated high sensitivity troponin I (hsTnI) values and significant  changes across serial measurements may suggest ACS but many other  chronic and acute conditions are known to elevate hsTnI results.  Refer to the "Links" section for chest pain algorithms and additional  guidance. Performed at [facility], [place] [address], [place],  [address]   Blood culture (routine x 2)     Status: None (Preliminary result)   Collection Time: 11/18/21  8:00 PM   Specimen: BLOOD RIGHT HAND  Result Value Ref  Range   Specimen Description BLOOD RIGHT HAND    Special Requests      BOTTLES DRAWN AEROBIC AND ANAEROBIC Blood Culture results may not be optimal due to an inadequate volume of blood received in culture bottles   Culture      NO GROWTH 2 DAYS Performed at [facility] [address] [place], [place] [address]    Report Status PENDING   Blood culture (routine x 2)     Status: None (Preliminary result)   Collection Time: 11/18/21  8:00 PM   Specimen: BLOOD RIGHT HAND  Result Value Ref Range   Specimen Description BLOOD RIGHT HAND    Special Requests      BOTTLES DRAWN AEROBIC AND ANAEROBIC Blood Culture results may not be optimal due to an inadequate volume of blood received in  culture bottles   Culture      NO GROWTH 2 DAYS Performed at [facility], [place] [address], [place], [place] [address]    Report Status PENDING   Lactic acid, plasma     Status: None   Collection Time: 11/18/21  8:00 PM  Result Value Ref Range   Lactic Acid, Venous 1.1 0.5 - 1.9 mmol/L    Comment: Performed at [facility], [place] [address], [place] [address]  CBC     Status: None   Collection Time: 11/18/21 10:30 PM  Result Value Ref Range   WBC 8.1 4.0 - 10.5 K/uL   RBC 4.37 3.87 - 5.11 MIL/uL   Hemoglobin 13.4 12.0 - 15.0 g/dL   HCT 38.9 36.0 - 46.0 %   MCV 89.0 80.0 - 100.0 fL   MCH 30.7 26.0 - 34.0 pg   MCHC 34.4 30.0 - 36.0 g/dL   RDW 12.2 11.5 - 15.5 %   Platelets 260 150 - 400 K/uL   nRBC 0.0 0.0 - 0.2 %    Comment: Performed at [facility], [place] [address] [place], [place] [address]  Creatinine, serum     Status: None   Collection Time: 11/18/21 10:30 PM  Result Value Ref Range   Creatinine, Ser 0.72 0.44 - 1.00 mg/dL   GFR, Estimated >60 >60 mL/min    Comment: (NOTE) Calculated using the CKD-EPI Creatinine Equation (2021) Performed at [facility] [address], [place], [place] [address]   HIV Antibody (routine testing w rflx)     Status: None   Collection Time: 11/18/21 10:30 PM  Result Value Ref Range   HIV Screen 4th Generation wRfx Non Reactive Non Reactive    Comment: Performed at [facility], [place] [address], [place],  [address]  Comprehensive metabolic panel     Status: Abnormal   Collection Time: 11/19/21  5:20 AM  Result Value Ref Range   Sodium 133 (L) 135 - 145 mmol/L   Potassium 3.1 (L) 3.5 - 5.1 mmol/L   Chloride 99 98 - 111 mmol/L   CO2 25 22 - 32 mmol/L   Glucose, Bld 110 (H) 70 - 99 mg/dL    Comment: Glucose reference range applies only to samples taken after fasting for at least 8 hours.   BUN 9 6 - 20 mg/dL   Creatinine, Ser 0.71 0.44 - 1.00 mg/dL   Calcium 7.7 (L) 8.9 - 10.3 mg/dL   Total  Protein 6.2 (L) 6.5 - 8.1 g/dL   Albumin 3.0 (L) 3.5 - 5.0 g/dL   AST 256 (H) 15 -  41 U/L   ALT 175 (H) 0 - 44 U/L   Alkaline Phosphatase 201 (H) 38 - 126 U/L   Total Bilirubin 1.0 0.3 - 1.2 mg/dL   GFR, Estimated >60 >60 mL/min    Comment: (NOTE) Calculated using the CKD-EPI Creatinine Equation (2021)    Anion gap 9 5 - 15    Comment: Performed at [facility] [address], [place], [place] [address]  CBC     Status: None   Collection Time: 11/19/21  5:20 AM  Result Value Ref Range   WBC 6.7 4.0 - 10.5 K/uL   RBC 4.19 3.87 - 5.11 MIL/uL   Hemoglobin 13.1 12.0 - 15.0 g/dL   HCT 36.7 36.0 - 46.0 %   MCV 87.6 80.0 - 100.0 fL   MCH 31.3 26.0 - 34.0 pg   MCHC 35.7 30.0 - 36.0 g/dL   RDW 12.2 11.5 - 15.5 %   Platelets 252 150 - 400 K/uL   nRBC 0.0 0.0 - 0.2 %    Comment: Performed at [facility], [place] [address] [place], [place] [address]  ANA     Status: None   Collection Time: 11/19/21  3:13 PM  Result Value Ref Range   Anti Nuclear Antibody (ANA) Negative Negative    Comment: (NOTE) Performed At: [facility] [place], [place] [id number] Rush Farmer MD IH:[id number]   Hepatitis panel, acute     Status: None   Collection Time: 11/19/21  3:13 PM  Result Value Ref Range   Hepatitis B Surface Ag NON REACTIVE NON REACTIVE   HCV Ab NON REACTIVE NON REACTIVE    Comment: (NOTE) Nonreactive HCV antibody screen is consistent with no HCV infections,  unless recent infection is suspected or other evidence exists to indicate HCV infection.     Hep A IgM NON REACTIVE NON REACTIVE   Hep B C IgM NON REACTIVE NON REACTIVE    Comment: Performed at [facility], [place] [address], [place], [place] [address]  Lipase, blood     Status: None   Collection Time: 11/19/21  3:17 PM  Result Value Ref Range   Lipase 43 11 - 51 U/L    Comment: Performed at [facility] [address] [place], [place] [address]  CBC with Differential/Platelet      Status: None   Collection Time: 11/20/21  3:35 AM  Result Value Ref Range   WBC 5.9 4.0 - 10.5 K/uL   RBC 4.16 3.87 - 5.11 MIL/uL   Hemoglobin 12.9 12.0 - 15.0 g/dL   HCT 37.3 36.0 - 46.0 %   MCV 89.7 80.0 - 100.0 fL   MCH 31.0 26.0 - 34.0 pg   MCHC 34.6 30.0 - 36.0 g/dL   RDW 12.4 11.5 - 15.5 %   Platelets 237 150 - 400 K/uL   nRBC 0.0 0.0 - 0.2 %   Neutrophils Relative % 60 %   Neutro Abs 3.5 1.7 - 7.7 K/uL   Lymphocytes Relative 29 %   Lymphs Abs 1.7 0.7 - 4.0 K/uL   Monocytes Relative 8 %   Monocytes Absolute 0.5 0.1 - 1.0 K/uL   Eosinophils Relative 2 %   Eosinophils Absolute 0.1 0.0 - 0.5 K/uL   Basophils Relative 0 %   Basophils Absolute 0.0 0.0 - 0.1 K/uL   Immature Granulocytes 1 %   Abs Immature Granulocytes 0.04 0.00 - 0.07 K/uL    Comment: Performed at  [facility], [place] [address] [place], [place] [address]  Comprehensive metabolic panel     Status: Abnormal   Collection Time: 11/20/21  3:35 AM  Result Value Ref Range   Sodium 135 135 - 145 mmol/L   Potassium 3.7 3.5 - 5.1 mmol/L   Chloride 103 98 - 111 mmol/L   CO2 27 22 - 32 mmol/L   Glucose, Bld 94 70 - 99 mg/dL    Comment: Glucose reference range applies only to samples taken after fasting for at least 8 hours.   BUN 6 6 - 20 mg/dL   Creatinine, Ser 0.74 0.44 - 1.00 mg/dL   Calcium 7.6 (L) 8.9 - 10.3 mg/dL   Total Protein 6.2 (L) 6.5 - 8.1 g/dL   Albumin 3.0 (L) 3.5 - 5.0 g/dL   AST 76 (H) 15 - 41 U/L   ALT 108 (H) 0 - 44 U/L   Alkaline Phosphatase 160 (H) 38 - 126 U/L   Total Bilirubin 0.5 0.3 - 1.2 mg/dL   GFR, Estimated >60 >60 mL/min    Comment: (NOTE) Calculated using the CKD-EPI Creatinine Equation (2021)    Anion gap 5 5 - 15    Comment: Performed at [facility], [place] [address], [place], [place] [address]  Magnesium     Status: None   Collection Time: 11/20/21  3:35 AM  Result Value Ref Range   Magnesium 1.9 1.7 - 2.4 mg/dL    Comment: Performed at [facility] [address] [place], [place] [address]    Studies/Results: US Abdomen Limited RUQ (LIVER/GB)  Result Date: 11/19/2021 CLINICAL DATA:  Elevated liver enzymes EXAM: ULTRASOUND ABDOMEN LIMITED RIGHT UPPER QUADRANT COMPARISON:  None Available. FINDINGS: Gallbladder: Surgically absent Common bile duct: Diameter: 8.4 mm Liver: No focal lesion identified. Within normal limits in parenchymal echogenicity. Portal vein is patent on color Doppler imaging with normal direction of blood flow towards the liver. Other: Small right pleural effusion. IMPRESSION: 1. Status post cholecystectomy. 2. No focal hepatic lesion. 3. Small right pleural effusion. Electronically Signed   By: Kathreen Devoid M.D.   On: 11/19/2021 10:50   CT Angio Chest/Abd/Pel for Dissection W and/or Wo Contrast  Result Date: 11/18/2021 CLINICAL DATA:  Chest pain or back pain, aortic dissection suspected. EXAM: CT ANGIOGRAPHY CHEST, ABDOMEN AND PELVIS TECHNIQUE: Non-contrast CT of the chest was initially obtained. Multidetector CT imaging through the chest, abdomen and pelvis was performed using the standard protocol during bolus administration of intravenous contrast. Multiplanar reconstructed images and MIPs were obtained and reviewed to evaluate the vascular anatomy. RADIATION DOSE REDUCTION: This exam was performed according to the departmental dose-optimization program which includes automated exposure control, adjustment of the mA and/or kV according to patient size and/or use of iterative reconstruction technique. CONTRAST:  55mL OMNIPAQUE IOHEXOL 350 MG/ML SOLN COMPARISON:  CT abdomen pelvis November 18, 2021. FINDINGS: CTA CHEST FINDINGS Cardiovascular: Noncontrast enhanced CT of the chest does not demonstrate intramural hematoma. Preferential opacification of the thoracic aorta postcontrast administration without no evidence of thoracic aortic aneurysm or dissection. No central pulmonary embolus on this nondedicated study. Normal heart size. No  pericardial effusion. Mediastinum/Nodes: No supraclavicular adenopathy. No suspicious thyroid nodule. No pathologically enlarged mediastinal, hilar or axillary lymph nodes. Mild symmetric esophageal wall thickening. Lungs/Pleura: Trace bilateral pleural effusions. Hypoventilatory change in the dependent lungs. No pneumothorax. Musculoskeletal: No acute osseous abnormality. Review of the MIP images confirms the above findings. CTA ABDOMEN AND PELVIS FINDINGS VASCULAR  Aorta: Normal caliber aorta without aneurysm, dissection, vasculitis or significant stenosis. Celiac: Patent without evidence of aneurysm, dissection, vasculitis or significant stenosis. SMA: Patent without evidence of aneurysm, dissection, vasculitis or significant stenosis. Renals: Renal arteries are patent without evidence of aneurysm, dissection, vasculitis, fibromuscular dysplasia or significant stenosis. IMA: Patent without evidence of aneurysm, dissection, vasculitis or significant stenosis. Inflow: Patent without evidence of aneurysm, dissection, vasculitis or significant stenosis. Veins: No obvious venous abnormality within the limitations of this arterial phase study. Review of the MIP images confirms the above findings. NON-VASCULAR Hepatobiliary: No suspicious hepatic lesion. Similar prominence of the biliary tree post cholecystectomy. Pancreas: No pancreatic ductal dilation or evidence of acute inflammation. Spleen: No splenomegaly. Adrenals/Urinary Tract: Bilateral adrenal glands are within normal limits. No hydronephrosis. Excreted contrast material in the kidneys from prior contrast CT of the abdomen pelvis. 12 mm fluid density left renal lesion is compatible with a cyst and considered benign requiring no independent imaging follow-up. Urinary bladder is unremarkable. Stomach/Bowel: Stomach is unremarkable for degree of distension. No pathologic dilation of small or large bowel. The appendix and terminal ileum appear normal. Moderate  volume of formed stool in the colon. No evidence of acute bowel inflammation. Lymphatic: No pathologically enlarged abdominal or pelvic lymph nodes. Reproductive: Status post hysterectomy. No adnexal masses. Other: No significant abdominopelvic free fluid. Musculoskeletal: No acute osseous abnormality. Review of the MIP images confirms the above findings. IMPRESSION: 1. No evidence of aortic aneurysm or dissection. 2. Trace bilateral pleural effusions. 3. Mild symmetric esophageal wall thickening, suggestive of esophagitis. 4. Moderate volume of formed stool in the colon. Electronically Signed   By: Dahlia Bailiff M.D.   On: 11/18/2021 18:27   DG Chest 1 View  Result Date: 11/18/2021 CLINICAL DATA:  Sore throat since Sunday, decreased oral intake, chest pain EXAM: CHEST  1 VIEW COMPARISON:  05/19/2020 FINDINGS: Single frontal view of the chest demonstrates an unremarkable cardiac silhouette. No airspace disease, effusion, or pneumothorax. No acute bony abnormalities. IMPRESSION: 1. No acute intrathoracic process. Electronically Signed   By: Randa Ngo M.D.   On: 11/18/2021 18:00   CT ABDOMEN PELVIS W CONTRAST  Result Date: 11/18/2021 CLINICAL DATA:  Abdominal pain EXAM: CT ABDOMEN AND PELVIS WITH CONTRAST TECHNIQUE: Multidetector CT imaging of the abdomen and pelvis was performed using the standard protocol following bolus administration of intravenous contrast. RADIATION DOSE REDUCTION: This exam was performed according to the departmental dose-optimization program which includes automated exposure control, adjustment of the mA and/or kV according to patient size and/or use of iterative reconstruction technique. CONTRAST:  21mL OMNIPAQUE IOHEXOL 350 MG/ML SOLN COMPARISON:  05/19/2020 FINDINGS: Lower chest: Minimal bilateral pleural effusions are seen. There is no focal consolidation in the visualized lower lung fields. Hepatobiliary: No focal abnormalities are seen in liver. There is mild prominence of  intrahepatic bile ducts. Distal common bile duct in the head of the pancreas measures 10 mm. Surgical clips are seen in gallbladder fossa. Pancreas: No focal abnormalities are seen. Spleen: Unremarkable. Adrenals/Urinary Tract: Adrenals are unremarkable. There is no hydronephrosis. There are no renal or ureteral stones. There are a few smooth marginated low-density lesions in both kidneys each measuring less than 12 mm. Urinary bladder is unremarkable. Stomach/Bowel: Stomach is not distended. Small bowel loops are not dilated. Appendix is not dilated. There is circumferential wall thickening in short 1.7 cm segment of sigmoid colon in image 81 of series 3. There is no pericolic stranding. Vascular/Lymphatic: Vascular structures are unremarkable. There are subcentimeter nodes  in mesentery and retroperitoneum suggesting possible benign reactive hyperplasia. Reproductive: Uterus is not seen.  There are no adnexal masses. Other: There is no ascites or pneumoperitoneum. Umbilical hernia containing fat is seen. Musculoskeletal: No acute findings are seen. IMPRESSION: No acute findings are seen in abdomen and pelvis. There is no evidence of intestinal obstruction or pneumoperitoneum. Appendix is not dilated. There is no hydronephrosis. There is 1.7 cm short segment circumferential wall thickening in sigmoid colon. This may be due to incomplete distention or inflammatory or neoplastic process. Endoscopic correlation should be considered. Slight prominence of intrahepatic bile ducts may be due to previous cholecystectomy. Minimal bilateral pleural effusions. Bilateral renal cysts. Other findings as described in the body of the report. Electronically Signed   By: Elmer Picker M.D.   On: 11/18/2021 17:22    Medications: I have reviewed the patient's current medications.  Assessment: LFTs improving, T. bili/AST/ALT/ALP of 0.5/76/108/160 today ANA negative Hepatitis B, hepatitis C, hepatitis A serology  negative  CT abdomen and pelvis 11/18/2021 at 17:22 showed 1.7 cm short segment circumferential wall thickening of sigmoid, possible under distention, inflammatory, neoplastic process CT angio, chest, abdomen, pelvis, 11/18/2021 at 18:27 showed no pathologic dilation of small and large bowel, no acute inflammation, moderate stool volume  Oral ulcers, tongue ulcers Suspected acute pyelonephritis   Plan: Possible Dili related to amoxicillin, LFTs improving, reassuring No evidence of pancreatitis on imaging Esophagitis noted, continue with PPI Short segment sigmoid colon wall thickening noted on 1 imaging, not noted on same imaging performed few hours apart, as an outpatient may benefit from endoscopy and colonoscopy. Labs for autoimmune hepatitis, HSV, antimitochondrial antibody can be followed as outpatient. Patient is on Magic mouthwash suspension Continue pantoprazole 40 mg IV every 12 hours. We will start sucralfate 1 g / 10 mL suspension 4 times daily. We will sign off from GI standpoint.   Ronnette Juniper, MD 11/20/2021, 3:35 PM

## 2021-11-20 NOTE — Progress Notes (Signed)
Pharmacy Antibiotic Note  [name] is a 46 y.o. female admitted on 11/18/2021 with  continued febrile state while actively being treated with CTX. Possible acute pyelonephritis .  Pharmacy has been consulted for cefepime dosing.  Plan: Cefepime 2 grams iv q8h  Height: 5\' 8"  (172.7 cm) Weight: 104.3 kg (230 lb) IBW/kg (Calculated) : 63.9  Temp (24hrs), Avg:100.4 F (38 C), Min:98.2 F (36.8 C), Max:103.1 F (39.5 C)  Recent Labs  Lab 11/18/21 1307 11/18/21 2000 11/18/21 2230 11/19/21 0520 11/20/21 0335  WBC 12.6*  --  8.1 6.7 5.9  CREATININE 0.86  --  0.72 0.71 0.74  LATICACIDVEN  --  1.1  --   --   --     Estimated Creatinine Clearance: 109.9 mL/min (by C-G formula based on SCr of 0.74 mg/dL).    Allergies  Allergen Reactions   Banana Nausea And Vomiting   Cucumber Extract Nausea And Vomiting   Eggs Or Egg-Derived Products Nausea And Vomiting    Antimicrobials this admission: CTX 11/2 >> 11/4 Cefepime 11/4 >>   Flagyl 11/3 >>   Microbiology results: 11/2- Bcx x2- [id number]    Thank you for allowing pharmacy to be a part of this patient's care.  Vaughan Basta BS, PharmD, BCPS Clinical Pharmacist 11/20/2021 8:51 AM  Contact: [phone] after 3 PM  "Be curious, not judgmental..." -Jamal Maes

## 2021-11-20 NOTE — Plan of Care (Signed)

## 2021-11-21 DIAGNOSIS — Z720 Tobacco use: Secondary | ICD-10-CM | POA: Diagnosis not present

## 2021-11-21 DIAGNOSIS — N10 Acute tubulo-interstitial nephritis: Secondary | ICD-10-CM | POA: Diagnosis not present

## 2021-11-21 LAB — CBC WITH DIFFERENTIAL/PLATELET
Abs Immature Granulocytes: 0 10*3/uL (ref 0.00–0.07)
Basophils Absolute: 0.1 10*3/uL (ref 0.0–0.1)
Basophils Relative: 3 %
Eosinophils Absolute: 0 10*3/uL (ref 0.0–0.5)
Eosinophils Relative: 0 %
HCT: 37.4 % (ref 36.0–46.0)
Hemoglobin: 12.5 g/dL (ref 12.0–15.0)
Lymphocytes Relative: 25 %
Lymphs Abs: 1.2 10*3/uL (ref 0.7–4.0)
MCH: 30.9 pg (ref 26.0–34.0)
MCHC: 33.4 g/dL (ref 30.0–36.0)
MCV: 92.3 fL (ref 80.0–100.0)
Monocytes Absolute: 0.2 10*3/uL (ref 0.1–1.0)
Monocytes Relative: 4 %
Neutro Abs: 3.2 10*3/uL (ref 1.7–7.7)
Neutrophils Relative %: 68 %
Platelets: 222 10*3/uL (ref 150–400)
RBC: 4.05 MIL/uL (ref 3.87–5.11)
RDW: 12.3 % (ref 11.5–15.5)
WBC: 4.7 10*3/uL (ref 4.0–10.5)
nRBC: 0 % (ref 0.0–0.2)
nRBC: 1 /100 WBC — ABNORMAL HIGH

## 2021-11-21 LAB — COMPREHENSIVE METABOLIC PANEL
ALT: 71 U/L — ABNORMAL HIGH (ref 0–44)
AST: 31 U/L (ref 15–41)
Albumin: 2.9 g/dL — ABNORMAL LOW (ref 3.5–5.0)
Alkaline Phosphatase: 124 U/L (ref 38–126)
Anion gap: 10 (ref 5–15)
BUN: 6 mg/dL (ref 6–20)
CO2: 25 mmol/L (ref 22–32)
Calcium: 7.9 mg/dL — ABNORMAL LOW (ref 8.9–10.3)
Chloride: 102 mmol/L (ref 98–111)
Creatinine, Ser: 0.64 mg/dL (ref 0.44–1.00)
GFR, Estimated: >60 mL/min (ref >60)
Glucose, Bld: 97 mg/dL (ref 70–99)
Potassium: 4.3 mmol/L (ref 3.5–5.1)
Sodium: 137 mmol/L (ref 135–145)
Total Bilirubin: 0.5 mg/dL (ref 0.3–1.2)
Total Protein: 6 g/dL — ABNORMAL LOW (ref 6.5–8.1)

## 2021-11-21 LAB — MITOCHONDRIAL ANTIBODIES: Mitochondrial M2 Ab, IgG: <20 Units (ref 0.0–20.0)

## 2021-11-21 LAB — URINE CULTURE: Culture: NO GROWTH

## 2021-11-21 LAB — IGG: IgG (Immunoglobin G), Serum: 1274 mg/dL (ref 586–1602)

## 2021-11-21 LAB — SEDIMENTATION RATE: Sed Rate: 21 mm/hr (ref 0–22)

## 2021-11-21 LAB — MAGNESIUM: Magnesium: 2 mg/dL (ref 1.7–2.4)

## 2021-11-21 LAB — ANTI-SMOOTH MUSCLE ANTIBODY, IGG: F-Actin IgG: 6 Units (ref 0–19)

## 2021-11-21 LAB — C-REACTIVE PROTEIN: CRP: 6.3 mg/dL — ABNORMAL HIGH (ref <1.0)

## 2021-11-21 MED ORDER — MAGIC MOUTHWASH W/LIDOCAINE
10.0000 mL | Freq: Four times a day (QID) | ORAL | Status: DC | PRN
  Administered 2021-11-21: 10 mL via ORAL
  Administered 2021-11-22: 5 mL via ORAL
  Filled 2021-11-21 (×3): qty 10

## 2021-11-21 MED ORDER — OXYCODONE HCL 5 MG PO TABS
5.0000 mg | ORAL_TABLET | ORAL | Status: DC | PRN
  Administered 2021-11-21 – 2021-11-23 (×8): 5 mg via ORAL
  Filled 2021-11-21 (×8): qty 1

## 2021-11-21 MED ORDER — METHOCARBAMOL 500 MG PO TABS
500.0000 mg | ORAL_TABLET | Freq: Four times a day (QID) | ORAL | Status: DC | PRN
  Administered 2021-11-21 – 2021-11-23 (×2): 500 mg via ORAL
  Filled 2021-11-21 (×2): qty 1

## 2021-11-21 MED ORDER — METHYLPREDNISOLONE SODIUM SUCC 125 MG IJ SOLR
80.0000 mg | INTRAMUSCULAR | Status: DC
  Administered 2021-11-21 – 2021-11-23 (×3): 80 mg via INTRAVENOUS
  Filled 2021-11-21 (×3): qty 2

## 2021-11-21 MED ORDER — PROCHLORPERAZINE EDISYLATE 10 MG/2ML IJ SOLN
10.0000 mg | Freq: Four times a day (QID) | INTRAMUSCULAR | Status: DC | PRN
  Administered 2021-11-21 – 2021-11-23 (×3): 10 mg via INTRAVENOUS
  Filled 2021-11-21 (×3): qty 2

## 2021-11-21 NOTE — Progress Notes (Signed)
PROGRESS NOTE    [name]  LHT:[id number] DOB: 03/06/1975 DOA: 11/18/2021 PCP: Pcp, No   Brief Narrative:  46 year old female with history of tobacco abuse presented with dysuria and hematuria and abdominal pain.  On presentation, UA indicated UTI.  Lipase of 110.  CT angio chest/abdomen/pelvis showed no evidence of aortic aneurysm or dissection but showed mild symmetric esophageal wall thickening suggestive of esophagitis.  CT of abdomen and pelvis with contrast showed no acute findings.  No intestinal obstruction or pneumoperitoneum but showed thickening in sigmoid colon: Due to incomplete distention or inflammatory or neoplastic process along with slight prominence of intrahepatic bile ducts may be due to previous cholecystectomy.  She was started on IV antibiotics, fluids and analgesics.  GI was consulted.  Assessment & Plan:   Possible acute pyelonephritis -Although CT did not show evidence of pyelonephritis but patient has features of abdominal/flank pain with UTI and leukocytosis - Blood cultures negative so far.  Urine culture negative so far. -Tmax of 100.2 over the last 24 hours.  Continue cefepime.  DC Flagyl.  Severe abdominal pain with vomiting Possible acute esophagitis Sigmoid colon thickening -Imaging as above.  Continue IV Protonix and as needed oral Maalox.  Continue Flagyl.  -GI signed off on 11/20/2021: Outpatient follow-up with GI.  Sigmoid thickening was only seen in one CT scan on admission and was not shown on the other one. DC flagyl.  Might benefit from outpatient endoscopy and colonoscopy. -Advance diet as tolerated. -Continue IV fluids.  Elevated LFTs -LFTs normal on presentation but elevated on 11/19/2021.  Questionable cause.  LFT improving.  Repeat a.m. LFTs.  Right upper quadrant ultrasound was negative for any focal hepatic lesion..  Hyponatremia -Improved.  Oral ulcers -Continue Magic mouthwash as needed.  Questionable cause.  Start a trial of  empiric IV Solu-Medrol.  Hypokalemia -Improved  Leukocytosis -Resolved  Obesity -Outpatient follow-up    DVT prophylaxis: SCDs Code Status: Full Family Communication: Daughter at bedside Disposition Plan: Status is: Inpatient Remains inpatient appropriate because: Of severity of illness  Consultants: GI  Procedures: None  Antimicrobials:  Anti-infectives (From admission, onward)    Start     Dose/Rate Route Frequency Ordered Stop   11/20/21 1800  ceFEPIme (MAXIPIME) 2 g in sodium chloride 0.9 % 100 mL IVPB        2 g 200 mL/hr over 30 Minutes Intravenous Every 8 hours 11/20/21 1350     11/20/21 0945  ceFEPIme (MAXIPIME) 2 g in sodium chloride 0.9 % 100 mL IVPB  Status:  Discontinued        2 g 200 mL/hr over 30 Minutes Intravenous Every 8 hours 11/20/21 0847 11/20/21 1350   11/19/21 1200  metroNIDAZOLE (FLAGYL) IVPB 500 mg        500 mg 100 mL/hr over 60 Minutes Intravenous Every 12 hours 11/19/21 1107     11/19/21 1000  cefTRIAXone (ROCEPHIN) 1 g in sodium chloride 0.9 % 100 mL IVPB  Status:  Discontinued        1 g 200 mL/hr over 30 Minutes Intravenous Every 24 hours 11/18/21 2026 11/20/21 0809   11/18/21 2030  cefTRIAXone (ROCEPHIN) 1 g in sodium chloride 0.9 % 100 mL IVPB  Status:  Discontinued        1 g 200 mL/hr over 30 Minutes Intravenous Every 24 hours 11/18/21 2016 11/18/21 2026   11/18/21 1600  cefTRIAXone (ROCEPHIN) 2 g in sodium chloride 0.9 % 100 mL IVPB  2 g 200 mL/hr over 30 Minutes Intravenous  Once 11/18/21 1548 11/18/21 1652         Subjective: Patient seen and examined at bedside.  Still complains of oral/mouth pain with intermittent flank pain.  Does not feel well.  No chest pain, vomiting, shortness of breath reported. Objective: Vitals:   11/20/21 1716 11/20/21 2108 11/21/21 0437 11/21/21 0500  BP: 119/76 123/84 131/85   Pulse: 68 75 75   Resp:  18 18   Temp: 99.2 F (37.3 C) 100.2 F (37.9 C) 99.4 F (37.4 C)   TempSrc:  Oral Oral Oral   SpO2: 97% 97% 95%   Weight:    105.1 kg  Height:        Intake/Output Summary (Last 24 hours) at 11/21/2021 0824 Last data filed at 11/21/2021 0601 Gross per 24 hour  Intake 2264.52 ml  Output --  Net 2264.52 ml    Filed Weights   11/18/21 1304 11/21/21 0500  Weight: 104.3 kg 105.1 kg    Examination:  General: No acute distress.  Still on room air. ENT/neck: No obvious JVD elevation or palpable neck masses.  Oral/tongue ulcers noted  respiratory: Bilateral decreased breath sounds at bases with scattered crackles  CVS: Mostly rate controlled; S1 and S2 are heard  abdominal: Soft, tender in the epigastric region, distended currently; no organomegaly, normal bowel sounds heard  genitourinary: Mild left CVA tenderness present Extremities: No clubbing; no lower extremity edema CNS: Alert and oriented.  No focal neurologic deficit.  Able to move his  lymph: No palpable lymphadenopathy noted Skin: No obvious rashes/petechiae psych: Intermittently looks anxious.  Currently not agitated. Musculoskeletal: No obvious joint tenderness/erythema    Data Reviewed: I have personally reviewed following labs and imaging studies  CBC: Recent Labs  Lab 11/18/21 1307 11/18/21 2230 11/19/21 0520 11/20/21 0335 11/21/21 0358  WBC 12.6* 8.1 6.7 5.9 4.7  NEUTROABS 10.4*  --   --  3.5 3.2  HGB 15.1* 13.4 13.1 12.9 12.5  HCT 44.0 38.9 36.7 37.3 37.4  MCV 90.0 89.0 87.6 89.7 92.3  PLT 311 260 252 237 222    Basic Metabolic Panel: Recent Labs  Lab 11/18/21 1307 11/18/21 2230 11/19/21 0520 11/20/21 0335 11/21/21 0358  NA 135  --  133* 135 137  K 3.5  --  3.1* 3.7 4.3  CL 102  --  99 103 102  CO2 20*  --  25 27 25   GLUCOSE 86  --  110* 94 97  BUN 21*  --  9 6 6   CREATININE 0.86 0.72 0.71 0.74 0.64  CALCIUM 8.7*  --  7.7* 7.6* 7.9*  MG  --   --   --  1.9 2.0    GFR: Estimated Creatinine Clearance: 110.3 mL/min (by C-G formula based on SCr of 0.64 mg/dL). Liver  Function Tests: Recent Labs  Lab 11/18/21 1307 11/19/21 0520 11/20/21 0335 11/21/21 0358  AST 30 256* 76* 31  ALT 24 175* 108* 71*  ALKPHOS 57 201* 160* 124  BILITOT 0.5 1.0 0.5 0.5  PROT 7.7 6.2* 6.2* 6.0*  ALBUMIN 3.8 3.0* 3.0* 2.9*    Recent Labs  Lab 11/18/21 1730 11/19/21 1517  LIPASE 110* 43    No results for input(s): "AMMONIA" in the last 168 hours. Coagulation Profile: No results for input(s): "INR", "PROTIME" in the last 168 hours. Cardiac Enzymes: No results for input(s): "CKTOTAL", "CKMB", "CKMBINDEX", "TROPONINI" in the last 168 hours. BNP (last 3 results) No results for  input(s): "PROBNP" in the last 8760 hours. HbA1C: No results for input(s): "HGBA1C" in the last 72 hours. CBG: No results for input(s): "GLUCAP" in the last 168 hours. Lipid Profile: No results for input(s): "CHOL", "HDL", "LDLCALC", "TRIG", "CHOLHDL", "LDLDIRECT" in the last 72 hours. Thyroid Function Tests: No results for input(s): "TSH", "T4TOTAL", "FREET4", "T3FREE", "THYROIDAB" in the last 72 hours. Anemia Panel: No results for input(s): "VITAMINB12", "FOLATE", "FERRITIN", "TIBC", "IRON", "RETICCTPCT" in the last 72 hours. Sepsis Labs: Recent Labs  Lab 11/18/21 2000  LATICACIDVEN 1.1     Recent Results (from the past 240 hour(s))  Group A Strep by PCR     Status: None   Collection Time: 11/18/21  1:00 PM   Specimen: Urine, Clean Catch; Sterile Swab  Result Value Ref Range Status   Group A Strep by PCR NOT DETECTED NOT DETECTED Final    Comment: Performed at [facility], [address] [place], [place] [address]  Resp Panel by RT-PCR (Flu A&B, Covid) Anterior Nasal Swab     Status: None   Collection Time: 11/18/21  3:10 PM   Specimen: Anterior Nasal Swab  Result Value Ref Range Status   SARS Coronavirus 2 by RT PCR NEGATIVE NEGATIVE Final    Comment: (NOTE) SARS-CoV-2 target nucleic acids are NOT DETECTED.  The SARS-CoV-2 RNA is generally detectable in upper  respiratory specimens during the acute phase of infection. The lowest concentration of SARS-CoV-2 viral copies this assay can detect is 138 copies/mL. A negative result does not preclude SARS-Cov-2 infection and should not be used as the sole basis for treatment or other patient management decisions. A negative result may occur with  improper specimen collection/handling, submission of specimen other than nasopharyngeal swab, presence of viral mutation(s) within the areas targeted by this assay, and inadequate number of viral copies(<138 copies/mL). A negative result must be combined with clinical observations, patient history, and epidemiological information. The expected result is Negative.  Fact Sheet for Patients:  BloggerCourse.com  Fact Sheet for Healthcare Providers:  SeriousBroker.it  This test is no t yet approved or cleared by the [place] FDA and  has been authorized for detection and/or diagnosis of SARS-CoV-2 by FDA under an Emergency Use Authorization (EUA). This EUA will remain  in effect (meaning this test can be used) for the duration of the COVID-19 declaration under Section 564(b)(1) of the Act, 21 U.S.C.section 360bbb-3(b)(1), unless the authorization is terminated  or revoked sooner.       Influenza A by PCR NEGATIVE NEGATIVE Final   Influenza B by PCR NEGATIVE NEGATIVE Final    Comment: (NOTE) The Xpert Xpress SARS-CoV-2/FLU/RSV plus assay is intended as an aid in the diagnosis of influenza from Nasopharyngeal swab specimens and should not be used as a sole basis for treatment. Nasal washings and aspirates are unacceptable for Xpert Xpress SARS-CoV-2/FLU/RSV testing.  Fact Sheet for Patients: BloggerCourse.com  Fact Sheet for Healthcare Providers: SeriousBroker.it  This test is not yet approved or cleared by the [place] FDA and has been  authorized for detection and/or diagnosis of SARS-CoV-2 by FDA under an Emergency Use Authorization (EUA). This EUA will remain in effect (meaning this test can be used) for the duration of the COVID-19 declaration under Section 564(b)(1) of the Act, 21 U.S.C. section 360bbb-3(b)(1), unless the authorization is terminated or revoked.  Performed at [facility], [address], [place], [place] [address]   Blood culture (routine x 2)     Status: None (  Preliminary result)   Collection Time: 11/18/21  8:00 PM   Specimen: BLOOD RIGHT HAND  Result Value Ref Range Status   Specimen Description BLOOD RIGHT HAND  Final   Special Requests   Final    BOTTLES DRAWN AEROBIC AND ANAEROBIC Blood Culture results may not be optimal due to an inadequate volume of blood received in culture bottles   Culture   Final    NO GROWTH 3 DAYS Performed at [facility], [address], [place], [place] [address]    Report Status PENDING  Incomplete  Blood culture (routine x 2)     Status: None (Preliminary result)   Collection Time: 11/18/21  8:00 PM   Specimen: BLOOD RIGHT HAND  Result Value Ref Range Status   Specimen Description BLOOD RIGHT HAND  Final   Special Requests   Final    BOTTLES DRAWN AEROBIC AND ANAEROBIC Blood Culture results may not be optimal due to an inadequate volume of blood received in culture bottles   Culture   Final    NO GROWTH 3 DAYS Performed at [facility], [address], [place], [place] [address]    Report Status PENDING  Incomplete         Radiology Studies: US Abdomen Limited RUQ (LIVER/GB)  Result Date: 11/19/2021 CLINICAL DATA:  Elevated liver enzymes EXAM: ULTRASOUND ABDOMEN LIMITED RIGHT UPPER QUADRANT COMPARISON:  None Available. FINDINGS: Gallbladder: Surgically absent Common bile duct: Diameter: 8.4 mm Liver: No focal lesion identified. Within normal limits in parenchymal echogenicity. Portal vein is patent on color Doppler imaging with  normal direction of blood flow towards the liver. Other: Small right pleural effusion. IMPRESSION: 1. Status post cholecystectomy. 2. No focal hepatic lesion. 3. Small right pleural effusion. Electronically Signed   By: Elige Ko M.D.   On: 11/19/2021 10:50        Scheduled Meds:  enoxaparin (LOVENOX) injection  50 mg Subcutaneous Q24H   pantoprazole (PROTONIX) IV  40 mg Intravenous Q12H   sucralfate  1 g Oral TID WC & HS   Continuous Infusions:  0.9 % NaCl with KCl 40 mEq / L 75 mL/hr at 11/21/21 0145   ceFEPime (MAXIPIME) IV 2 g (11/21/21 0146)   metronidazole 500 mg (11/20/21 2306)          Glade Lloyd, MD [facility] 11/21/2021, 8:24 AM

## 2021-11-22 DIAGNOSIS — N10 Acute tubulo-interstitial nephritis: Secondary | ICD-10-CM | POA: Diagnosis not present

## 2021-11-22 LAB — COMPREHENSIVE METABOLIC PANEL
ALT: 60 U/L — ABNORMAL HIGH (ref 0–44)
AST: 22 U/L (ref 15–41)
Albumin: 3.2 g/dL — ABNORMAL LOW (ref 3.5–5.0)
Alkaline Phosphatase: 122 U/L (ref 38–126)
Anion gap: 13 (ref 5–15)
BUN: 9 mg/dL (ref 6–20)
CO2: 27 mmol/L (ref 22–32)
Calcium: 9.1 mg/dL (ref 8.9–10.3)
Chloride: 100 mmol/L (ref 98–111)
Creatinine, Ser: 0.63 mg/dL (ref 0.44–1.00)
GFR, Estimated: >60 mL/min (ref >60)
Glucose, Bld: 127 mg/dL — ABNORMAL HIGH (ref 70–99)
Potassium: 4.2 mmol/L (ref 3.5–5.1)
Sodium: 140 mmol/L (ref 135–145)
Total Bilirubin: 0.6 mg/dL (ref 0.3–1.2)
Total Protein: 6.9 g/dL (ref 6.5–8.1)

## 2021-11-22 LAB — CBC WITH DIFFERENTIAL/PLATELET
Abs Immature Granulocytes: 0.02 10*3/uL (ref 0.00–0.07)
Basophils Absolute: 0 10*3/uL (ref 0.0–0.1)
Basophils Relative: 0 %
Eosinophils Absolute: 0 10*3/uL (ref 0.0–0.5)
Eosinophils Relative: 0 %
HCT: 40 % (ref 36.0–46.0)
Hemoglobin: 13.9 g/dL (ref 12.0–15.0)
Immature Granulocytes: 0 %
Lymphocytes Relative: 35 %
Lymphs Abs: 1.9 10*3/uL (ref 0.7–4.0)
MCH: 30.8 pg (ref 26.0–34.0)
MCHC: 34.8 g/dL (ref 30.0–36.0)
MCV: 88.5 fL (ref 80.0–100.0)
Monocytes Absolute: 0.6 10*3/uL (ref 0.1–1.0)
Monocytes Relative: 10 %
Neutro Abs: 3 10*3/uL (ref 1.7–7.7)
Neutrophils Relative %: 55 %
Platelets: 293 10*3/uL (ref 150–400)
RBC: 4.52 MIL/uL (ref 3.87–5.11)
RDW: 12.1 % (ref 11.5–15.5)
Smear Review: NORMAL
WBC: 5.5 10*3/uL (ref 4.0–10.5)
nRBC: 0 % (ref 0.0–0.2)

## 2021-11-22 LAB — MAGNESIUM: Magnesium: 2.3 mg/dL (ref 1.7–2.4)

## 2021-11-22 LAB — C-REACTIVE PROTEIN: CRP: 4.4 mg/dL — ABNORMAL HIGH (ref <1.0)

## 2021-11-22 MED ORDER — SENNOSIDES-DOCUSATE SODIUM 8.6-50 MG PO TABS
1.0000 | ORAL_TABLET | Freq: Two times a day (BID) | ORAL | Status: DC
  Administered 2021-11-22 – 2021-11-23 (×3): 1 via ORAL
  Filled 2021-11-22 (×3): qty 1

## 2021-11-22 MED ORDER — BISACODYL 10 MG RE SUPP: 10.0000 mg | Freq: Every day | RECTAL | Status: DC | PRN

## 2021-11-22 MED ORDER — POLYETHYLENE GLYCOL 3350 17 G PO PACK: 17.0000 g | PACK | Freq: Every day | ORAL | Status: DC | PRN

## 2021-11-22 NOTE — Progress Notes (Signed)
PROGRESS NOTE    ELIYANNA AULT  UMP:[id number] DOB: 03-25-1975 DOA: 11/18/2021 PCP: Pcp, No   Brief Narrative:  46 year old female with history of tobacco abuse presented with dysuria and hematuria and abdominal pain.  On presentation, UA indicated UTI.  Lipase of 110.  CT angio chest/abdomen/pelvis showed no evidence of aortic aneurysm or dissection but showed mild symmetric esophageal wall thickening suggestive of esophagitis.  CT of abdomen and pelvis with contrast showed no acute findings.  No intestinal obstruction or pneumoperitoneum but showed thickening in sigmoid colon: Due to incomplete distention or inflammatory or neoplastic process along with slight prominence of intrahepatic bile ducts may be due to previous cholecystectomy.  She was started on IV antibiotics, fluids and analgesics.  GI was consulted.  Assessment & Plan:   Possible acute pyelonephritis -Although CT did not show evidence of pyelonephritis but patient has features of abdominal/flank pain with UTI and leukocytosis - Blood cultures negative so far.  Urine culture negative so far. -Tmax of 99.1 over the last 24 hours.  Continue cefepime.  DC'd Flagyl.  Severe abdominal pain with vomiting Possible acute esophagitis Sigmoid colon thickening -Imaging as above.  Continue IV Protonix, oral Carafate and as needed oral Maalox.   -GI signed off on 11/20/2021: Outpatient follow-up with GI.  Sigmoid thickening was only seen in one CT scan on admission and was not shown on the other one. DC flagyl.  Might benefit from outpatient endoscopy and colonoscopy. -Advance diet as tolerated. -Oral intake slightly improving.  DC IV fluids.  Elevated LFTs -LFTs normal on presentation but elevated on 11/19/2021.  Questionable cause.  LFTs improving.  Repeat a.m. LFTs.  Right upper quadrant ultrasound was negative for any focal hepatic lesion..  Hyponatremia -Improved.  Oral ulcers -Continue Magic mouthwash with lidocaine as  needed.  Questionable cause.  Oral pain much improving after starting Solu-Medrol on 11/22/2018.  Continue current dose of Solu-Medrol.  Hypokalemia -Improved  Leukocytosis -Resolved  Obesity -Outpatient follow-up    DVT prophylaxis: SCDs Code Status: Full Family Communication: None at bedside Disposition Plan: Status is: Inpatient Remains inpatient appropriate because: Of severity of illness  Consultants: GI  Procedures: None  Antimicrobials:  Anti-infectives (From admission, onward)    Start     Dose/Rate Route Frequency Ordered Stop   11/20/21 1800  ceFEPIme (MAXIPIME) 2 g in sodium chloride 0.9 % 100 mL IVPB        2 g 200 mL/hr over 30 Minutes Intravenous Every 8 hours 11/20/21 1350     11/20/21 0945  ceFEPIme (MAXIPIME) 2 g in sodium chloride 0.9 % 100 mL IVPB  Status:  Discontinued        2 g 200 mL/hr over 30 Minutes Intravenous Every 8 hours 11/20/21 0847 11/20/21 1350   11/19/21 1200  metroNIDAZOLE (FLAGYL) IVPB 500 mg        500 mg 100 mL/hr over 60 Minutes Intravenous Every 12 hours 11/19/21 1107     11/19/21 1000  cefTRIAXone (ROCEPHIN) 1 g in sodium chloride 0.9 % 100 mL IVPB  Status:  Discontinued        1 g 200 mL/hr over 30 Minutes Intravenous Every 24 hours 11/18/21 2026 11/20/21 0809   11/18/21 2030  cefTRIAXone (ROCEPHIN) 1 g in sodium chloride 0.9 % 100 mL IVPB  Status:  Discontinued        1 g 200 mL/hr over 30 Minutes Intravenous Every 24 hours 11/18/21 2016 11/18/21 2026   11/18/21 1600  cefTRIAXone (ROCEPHIN) 2  g in sodium chloride 0.9 % 100 mL IVPB        2 g 200 mL/hr over 30 Minutes Intravenous  Once 11/18/21 1548 11/18/21 1652         Subjective: Patient seen and examined at bedside.  Still complains of mild intermittent flank pain and nausea.  Still has oral pain but improved compared to yesterday.  No fever, vomiting, chest pain reported.   Objective: Vitals:   11/21/21 1735 11/21/21 2115 11/22/21 0533 11/22/21 0920  BP: (!)  153/90 121/70 119/77 (!) 143/93  Pulse: 71 75 (!) 55 64  Resp:  18 18   Temp: 98.5 F (36.9 C) 99.1 F (37.3 C) 98.3 F (36.8 C) 98.5 F (36.9 C)  TempSrc: Oral Oral Oral Oral  SpO2: 96% 95% 96% 94%  Weight:      Height:        Intake/Output Summary (Last 24 hours) at 11/22/2021 1002 Last data filed at 11/22/2021 0600 Gross per 24 hour  Intake 1274.15 ml  Output 0 ml  Net 1274.15 ml    Filed Weights   11/18/21 1304 11/21/21 0500  Weight: 104.3 kg 105.1 kg    Examination:  General: On room air currently.  No distress currently  ENT/neck: No palpable thyromegaly or JVD elevation noted.  Oral/tongue ulcers noted  respiratory: Decreased breath sounds at bases bilaterally with some crackles CVS: S1-S2 heard; mild intermittent bradycardia present abdominal: Soft, slightly tender in the epigastric region, slightly distended; no organomegaly, bowel sounds are normally  genitourinary: Mild left CVA tenderness present Extremities: Mild lower extremity edema present.  No cyanosis CNS: Awake and alert.  No focal neurologic deficit.  Moving extremities lymph: No obvious cervical lymphadenopathy palpable  skin: No obvious ecchymosis/other lesions  psych: Still looks slightly anxious intermittently.  Not agitated  musculoskeletal: No obvious joint tenderness/erythema/swelling    Data Reviewed: I have personally reviewed following labs and imaging studies  CBC: Recent Labs  Lab 11/18/21 1307 11/18/21 2230 11/19/21 0520 11/20/21 0335 11/21/21 0358 11/22/21 0455  WBC 12.6* 8.1 6.7 5.9 4.7 5.5  NEUTROABS 10.4*  --   --  3.5 3.2 3.0  HGB 15.1* 13.4 13.1 12.9 12.5 13.9  HCT 44.0 38.9 36.7 37.3 37.4 40.0  MCV 90.0 89.0 87.6 89.7 92.3 88.5  PLT 311 260 252 237 222 293    Basic Metabolic Panel: Recent Labs  Lab 11/18/21 1307 11/18/21 2230 11/19/21 0520 11/20/21 0335 11/21/21 0358 11/22/21 0455  NA 135  --  133* 135 137 140  K 3.5  --  3.1* 3.7 4.3 4.2  CL 102  --  99  103 102 100  CO2 20*  --  25 27 25 27   GLUCOSE 86  --  110* 94 97 127*  BUN 21*  --  9 6 6 9   CREATININE 0.86 0.72 0.71 0.74 0.64 0.63  CALCIUM 8.7*  --  7.7* 7.6* 7.9* 9.1  MG  --   --   --  1.9 2.0 2.3    GFR: Estimated Creatinine Clearance: 110.3 mL/min (by C-G formula based on SCr of 0.63 mg/dL). Liver Function Tests: Recent Labs  Lab 11/18/21 1307 11/19/21 0520 11/20/21 0335 11/21/21 0358 11/22/21 0455  AST 30 256* 76* 31 22  ALT 24 175* 108* 71* 60*  ALKPHOS 57 201* 160* 124 122  BILITOT 0.5 1.0 0.5 0.5 0.6  PROT 7.7 6.2* 6.2* 6.0* 6.9  ALBUMIN 3.8 3.0* 3.0* 2.9* 3.2*    Recent Labs  Lab  11/18/21 1730 11/19/21 1517  LIPASE 110* 43    No results for input(s): "AMMONIA" in the last 168 hours. Coagulation Profile: No results for input(s): "INR", "PROTIME" in the last 168 hours. Cardiac Enzymes: No results for input(s): "CKTOTAL", "CKMB", "CKMBINDEX", "TROPONINI" in the last 168 hours. BNP (last 3 results) No results for input(s): "PROBNP" in the last 8760 hours. HbA1C: No results for input(s): "HGBA1C" in the last 72 hours. CBG: No results for input(s): "GLUCAP" in the last 168 hours. Lipid Profile: No results for input(s): "CHOL", "HDL", "LDLCALC", "TRIG", "CHOLHDL", "LDLDIRECT" in the last 72 hours. Thyroid Function Tests: No results for input(s): "TSH", "T4TOTAL", "FREET4", "T3FREE", "THYROIDAB" in the last 72 hours. Anemia Panel: No results for input(s): "VITAMINB12", "FOLATE", "FERRITIN", "TIBC", "IRON", "RETICCTPCT" in the last 72 hours. Sepsis Labs: Recent Labs  Lab 11/18/21 2000  LATICACIDVEN 1.1     Recent Results (from the past 240 hour(s))  Group A Strep by PCR     Status: None   Collection Time: 11/18/21  1:00 PM   Specimen: Urine, Clean Catch; Sterile Swab  Result Value Ref Range Status   Group A Strep by PCR NOT DETECTED NOT DETECTED Final    Comment: Performed at [facility], [address], [place], [place] [address]  Resp  Panel by RT-PCR (Flu A&B, Covid) Anterior Nasal Swab     Status: None   Collection Time: 11/18/21  3:10 PM   Specimen: Anterior Nasal Swab  Result Value Ref Range Status   SARS Coronavirus 2 by RT PCR NEGATIVE NEGATIVE Final    Comment: (NOTE) SARS-CoV-2 target nucleic acids are NOT DETECTED.  The SARS-CoV-2 RNA is generally detectable in upper respiratory specimens during the acute phase of infection. The lowest concentration of SARS-CoV-2 viral copies this assay can detect is 138 copies/mL. A negative result does not preclude SARS-Cov-2 infection and should not be used as the sole basis for treatment or other patient management decisions. A negative result may occur with  improper specimen collection/handling, submission of specimen other than nasopharyngeal swab, presence of viral mutation(s) within the areas targeted by this assay, and inadequate number of viral copies(<138 copies/mL). A negative result must be combined with clinical observations, patient history, and epidemiological information. The expected result is Negative.  Fact Sheet for Patients:  BloggerCourse.com  Fact Sheet for Healthcare Providers:  SeriousBroker.it  This test is no t yet approved or cleared by the [place] FDA and  has been authorized for detection and/or diagnosis of SARS-CoV-2 by FDA under an Emergency Use Authorization (EUA). This EUA will remain  in effect (meaning this test can be used) for the duration of the COVID-19 declaration under Section 564(b)(1) of the Act, 21 U.S.C.section 360bbb-3(b)(1), unless the authorization is terminated  or revoked sooner.       Influenza A by PCR NEGATIVE NEGATIVE Final   Influenza B by PCR NEGATIVE NEGATIVE Final    Comment: (NOTE) The Xpert Xpress SARS-CoV-2/FLU/RSV plus assay is intended as an aid in the diagnosis of influenza from Nasopharyngeal swab specimens and should not be used as a sole  basis for treatment. Nasal washings and aspirates are unacceptable for Xpert Xpress SARS-CoV-2/FLU/RSV testing.  Fact Sheet for Patients: BloggerCourse.com  Fact Sheet for Healthcare Providers: SeriousBroker.it  This test is not yet approved or cleared by the [place] FDA and has been authorized for detection and/or diagnosis of SARS-CoV-2 by FDA under an Emergency Use Authorization (EUA). This EUA will remain in effect (meaning  this test can be used) for the duration of the COVID-19 declaration under Section 564(b)(1) of the Act, 21 U.S.C. section 360bbb-3(b)(1), unless the authorization is terminated or revoked.  Performed at [facility], [place] [address] [place], [place] [address]   Blood culture (routine x 2)     Status: None (Preliminary result)   Collection Time: 11/18/21  8:00 PM   Specimen: BLOOD RIGHT HAND  Result Value Ref Range Status   Specimen Description BLOOD RIGHT HAND  Final   Special Requests   Final    BOTTLES DRAWN AEROBIC AND ANAEROBIC Blood Culture results may not be optimal due to an inadequate volume of blood received in culture bottles   Culture   Final    NO GROWTH 4 DAYS Performed at [facility], [place] [address], [place], [place] [address]    Report Status PENDING  Incomplete  Blood culture (routine x 2)     Status: None (Preliminary result)   Collection Time: 11/18/21  8:00 PM   Specimen: BLOOD RIGHT HAND  Result Value Ref Range Status   Specimen Description BLOOD RIGHT HAND  Final   Special Requests   Final    BOTTLES DRAWN AEROBIC AND ANAEROBIC Blood Culture results may not be optimal due to an inadequate volume of blood received in culture bottles   Culture   Final    NO GROWTH 4 DAYS Performed at [facility], [place] [address], [place], [place] [address]    Report Status PENDING  Incomplete  Urine Culture     Status: None   Collection Time: 11/20/21  9:53 AM   Specimen:  Urine, Clean Catch  Result Value Ref Range Status   Specimen Description URINE, CLEAN CATCH  Final   Special Requests NONE  Final   Culture   Final    NO GROWTH Performed at [facility], [place] [address], [place], [place] [address]    Report Status 11/21/2021 FINAL  Final         Radiology Studies: No results found.      Scheduled Meds:  enoxaparin (LOVENOX) injection  50 mg Subcutaneous Q24H   methylPREDNISolone (SOLU-MEDROL) injection  80 mg Intravenous Q24H   pantoprazole (PROTONIX) IV  40 mg Intravenous Q12H   sucralfate  1 g Oral TID WC & HS   Continuous Infusions:  0.9 % NaCl with KCl 40 mEq / L Stopped (11/22/21 0814)   ceFEPime (MAXIPIME) IV 2 g (11/22/21 0545)   metronidazole 500 mg (11/21/21 2322)          Aline August, MD [facility] 11/22/2021, 10:02 AM

## 2021-11-23 DIAGNOSIS — N10 Acute tubulo-interstitial nephritis: Secondary | ICD-10-CM | POA: Diagnosis not present

## 2021-11-23 LAB — COMPREHENSIVE METABOLIC PANEL
ALT: 44 U/L (ref 0–44)
AST: 18 U/L (ref 15–41)
Albumin: 3 g/dL — ABNORMAL LOW (ref 3.5–5.0)
Alkaline Phosphatase: 98 U/L (ref 38–126)
Anion gap: 12 (ref 5–15)
BUN: 16 mg/dL (ref 6–20)
CO2: 26 mmol/L (ref 22–32)
Calcium: 8.5 mg/dL — ABNORMAL LOW (ref 8.9–10.3)
Chloride: 99 mmol/L (ref 98–111)
Creatinine, Ser: 0.69 mg/dL (ref 0.44–1.00)
GFR, Estimated: >60 mL/min (ref >60)
Glucose, Bld: 164 mg/dL — ABNORMAL HIGH (ref 70–99)
Potassium: 3.6 mmol/L (ref 3.5–5.1)
Sodium: 137 mmol/L (ref 135–145)
Total Bilirubin: 0.6 mg/dL (ref 0.3–1.2)
Total Protein: 6.5 g/dL (ref 6.5–8.1)

## 2021-11-23 LAB — CBC WITH DIFFERENTIAL/PLATELET
Abs Immature Granulocytes: 0.06 10*3/uL (ref 0.00–0.07)
Basophils Absolute: 0 10*3/uL (ref 0.0–0.1)
Basophils Relative: 0 %
Eosinophils Absolute: 0 10*3/uL (ref 0.0–0.5)
Eosinophils Relative: 0 %
HCT: 36.8 % (ref 36.0–46.0)
Hemoglobin: 12.8 g/dL (ref 12.0–15.0)
Immature Granulocytes: 1 %
Lymphocytes Relative: 22 %
Lymphs Abs: 1.9 10*3/uL (ref 0.7–4.0)
MCH: 30.8 pg (ref 26.0–34.0)
MCHC: 34.8 g/dL (ref 30.0–36.0)
MCV: 88.7 fL (ref 80.0–100.0)
Monocytes Absolute: 0.5 10*3/uL (ref 0.1–1.0)
Monocytes Relative: 6 %
Neutro Abs: 6.3 10*3/uL (ref 1.7–7.7)
Neutrophils Relative %: 71 %
Platelets: 319 10*3/uL (ref 150–400)
RBC: 4.15 MIL/uL (ref 3.87–5.11)
RDW: 12.2 % (ref 11.5–15.5)
WBC: 8.8 10*3/uL (ref 4.0–10.5)
nRBC: 0 % (ref 0.0–0.2)

## 2021-11-23 LAB — CULTURE, BLOOD (ROUTINE X 2)
Culture: NO GROWTH
Culture: NO GROWTH

## 2021-11-23 LAB — MAGNESIUM: Magnesium: 2.2 mg/dL (ref 1.7–2.4)

## 2021-11-23 MED ORDER — METHOCARBAMOL 500 MG PO TABS: 500.0000 mg | ORAL_TABLET | Freq: Four times a day (QID) | ORAL | 0 refills | Status: DC | PRN

## 2021-11-23 MED ORDER — PREDNISONE 20 MG PO TABS: 40.0000 mg | ORAL_TABLET | Freq: Every day | ORAL | 0 refills | Status: AC

## 2021-11-23 MED ORDER — LIDOCAINE VISCOUS HCL 2 % MT SOLN: 15.0000 mL | Freq: Four times a day (QID) | OROMUCOSAL | 0 refills | Status: DC | PRN

## 2021-11-23 MED ORDER — POLYETHYLENE GLYCOL 3350 17 G PO PACK: 17.0000 g | PACK | Freq: Every day | ORAL | 0 refills | Status: DC | PRN

## 2021-11-23 MED ORDER — OXYCODONE HCL 5 MG PO TABS: 5.0000 mg | ORAL_TABLET | Freq: Four times a day (QID) | ORAL | 0 refills | Status: DC | PRN

## 2021-11-23 MED ORDER — SUCRALFATE 1 GM/10ML PO SUSP: 1.0000 g | Freq: Three times a day (TID) | ORAL | 0 refills | Status: DC

## 2021-11-23 MED ORDER — CEFADROXIL 500 MG PO CAPS: 1000.0000 mg | ORAL_CAPSULE | Freq: Two times a day (BID) | ORAL | 0 refills | Status: AC

## 2021-11-23 MED ORDER — PANTOPRAZOLE SODIUM 40 MG PO TBEC: 40.0000 mg | DELAYED_RELEASE_TABLET | Freq: Two times a day (BID) | ORAL | 0 refills | Status: DC

## 2021-11-23 MED ORDER — ONDANSETRON HCL 4 MG PO TABS: 4.0000 mg | ORAL_TABLET | Freq: Four times a day (QID) | ORAL | 0 refills | Status: DC | PRN

## 2021-11-23 NOTE — TOC Transition Note (Addendum)
Transition of Care [facility]) - CM/SW Discharge Note   Patient Details  Name: [name] MRN: [id number] Date of Birth: [birth year]  [facility]) CM/SW Contact:  Tom-Johnson, Renea Ee, RN Phone Number: 11/23/2021, 11:16 AM   Clinical Narrative:     Patient is scheduled for discharge today. Hospital f/u and new patient establishment appointment scheduled at Internal Medicine, info on AVS.  No PT/OT needs or recommendations  noted. Husband at bedside and will transport at discharge. No further TOC needs noted.   Final next level of care: Home/Self Care Barriers to Discharge: Barriers Resolved   Patient Goals and CMS Choice Patient states their goals for this hospitalization and ongoing recovery are:: To return home CMS Medicare.gov Compare Post Acute Care list provided to:: Patient Choice offered to / list presented to : NA  Discharge Placement                Patient to be transferred to facility by: Husband      Discharge Plan and Services                DME Arranged: N/A DME Agency: NA       HH Arranged: NA HH Agency: NA        Social Determinants of Health (SDOH) Interventions     Readmission Risk Interventions     No data to display

## 2021-11-23 NOTE — Progress Notes (Signed)
DISCHARGE NOTE HOME Colette S Archila to be discharged Home per MD order. Discussed prescriptions and follow up appointments with the patient. Prescriptions given to patient; medication list explained in detail. Patient verbalized understanding.  Skin clean, dry and intact without evidence of skin break down, no evidence of skin tears noted. IV catheter discontinued intact. Site without signs and symptoms of complications. Dressing and pressure applied. Pt denies pain at the site currently. No complaints noted.  Patient free of lines, drains, and wounds.   An After Visit Summary (AVS) was printed and given to the patient. Patient escorted via wheelchair, and discharged home via private auto.  Anastasio Auerbach, RN

## 2021-11-23 NOTE — Plan of Care (Signed)

## 2021-11-23 NOTE — Plan of Care (Signed)
  Problem: Education: Goal: Knowledge of General Education information will improve Description: Including pain rating scale, medication(s)/side effects and non-pharmacologic comfort measures 11/23/2021 1119 by Anastasio Auerbach, RN Outcome: Completed/Met 11/23/2021 1118 by Anastasio Auerbach, RN Outcome: Progressing   Problem: Health Behavior/Discharge Planning: Goal: Ability to manage health-related needs will improve 11/23/2021 1119 by Anastasio Auerbach, RN Outcome: Completed/Met 11/23/2021 1118 by Anastasio Auerbach, RN Outcome: Progressing   Problem: Clinical Measurements: Goal: Ability to maintain clinical measurements within normal limits will improve 11/23/2021 1119 by Anastasio Auerbach, RN Outcome: Completed/Met 11/23/2021 1118 by Anastasio Auerbach, RN Outcome: Progressing Goal: Will remain free from infection 11/23/2021 1119 by Anastasio Auerbach, RN Outcome: Completed/Met 11/23/2021 1118 by Anastasio Auerbach, RN Outcome: Progressing Goal: Diagnostic test results will improve 11/23/2021 1119 by Anastasio Auerbach, RN Outcome: Completed/Met 11/23/2021 1118 by Anastasio Auerbach, RN Outcome: Progressing Goal: Respiratory complications will improve 11/23/2021 1119 by Anastasio Auerbach, RN Outcome: Completed/Met 11/23/2021 1118 by Anastasio Auerbach, RN Outcome: Progressing Goal: Cardiovascular complication will be avoided 11/23/2021 1119 by Anastasio Auerbach, RN Outcome: Completed/Met 11/23/2021 1118 by Anastasio Auerbach, RN Outcome: Progressing   Problem: Activity: Goal: Risk for activity intolerance will decrease 11/23/2021 1119 by Anastasio Auerbach, RN Outcome: Completed/Met 11/23/2021 1118 by Anastasio Auerbach, RN Outcome: Progressing   Problem: Nutrition: Goal: Adequate nutrition will be maintained 11/23/2021 1119 by Anastasio Auerbach, RN Outcome: Completed/Met 11/23/2021 1118 by Anastasio Auerbach, RN Outcome: Progressing   Problem: Coping: Goal: Level of anxiety will decrease 11/23/2021 1119 by Anastasio Auerbach, RN Outcome:  Completed/Met 11/23/2021 1118 by Anastasio Auerbach, RN Outcome: Progressing   Problem: Elimination: Goal: Will not experience complications related to bowel motility 11/23/2021 1119 by Anastasio Auerbach, RN Outcome: Completed/Met 11/23/2021 1118 by Anastasio Auerbach, RN Outcome: Progressing Goal: Will not experience complications related to urinary retention 11/23/2021 1119 by Anastasio Auerbach, RN Outcome: Completed/Met 11/23/2021 1118 by Anastasio Auerbach, RN Outcome: Progressing   Problem: Pain Managment: Goal: General experience of comfort will improve 11/23/2021 1119 by Anastasio Auerbach, RN Outcome: Completed/Met 11/23/2021 1118 by Anastasio Auerbach, RN Outcome: Progressing   Problem: Safety: Goal: Ability to remain free from injury will improve 11/23/2021 1119 by Anastasio Auerbach, RN Outcome: Completed/Met 11/23/2021 1118 by Anastasio Auerbach, RN Outcome: Progressing   Problem: Skin Integrity: Goal: Risk for impaired skin integrity will decrease 11/23/2021 1119 by Anastasio Auerbach, RN Outcome: Completed/Met 11/23/2021 1118 by Anastasio Auerbach, RN Outcome: Progressing

## 2021-11-23 NOTE — Discharge Summary (Signed)
Physician Discharge Summary  [name] [id number] DOB: 1976/01/10 DOA: 11/18/2021  PCP: Pcp, No  Admit date: 11/18/2021 Discharge date: 11/23/2021  Admitted From: Home Disposition: Home  Recommendations for Outpatient Follow-up:  Follow up with PCP in 1 week with repeat CBC/CMP Outpatient follow-up with GI Follow up in ED if symptoms worsen or new appear   Home Health: No Equipment/Devices: None  Discharge Condition: Stable CODE STATUS: Full Diet recommendation: Heart healthy/soft diet  Brief/Interim Summary: 46 year old female with history of tobacco abuse presented with dysuria and hematuria and abdominal pain.  On presentation, UA indicated UTI.  Lipase of 110.  CT angio chest/abdomen/pelvis showed no evidence of aortic aneurysm or dissection but showed mild symmetric esophageal wall thickening suggestive of esophagitis.  CT of abdomen and pelvis with contrast showed no acute findings.  No intestinal obstruction or pneumoperitoneum but showed thickening in sigmoid colon: Due to incomplete distention or inflammatory or neoplastic process along with slight prominence of intrahepatic bile ducts may be due to previous cholecystectomy.  She was started on IV antibiotics, fluids and analgesics.  GI was consulted.  GI recommended to continue PPI twice daily along with oral sucralfate and signed off and recommended outpatient follow-up.  During the hospitalization, condition gradually improved.  She was put on IV Solu-Medrol for painful oral ulcers: Subsequently, her oral pain and oral ulcers are improving.  She is hemodynamically stable with no temperature spikes.  Cultures have been negative so far.  She feels okay to go home today.  Her LFTs had normalized.  She will be discharged home on oral antibiotics, oral prednisone along with oral Protonix.  Outpatient follow-up with PCP and GI.  Discharge Diagnoses:   Possible acute pyelonephritis -Although CT did not show evidence of  pyelonephritis but patient has features of abdominal/flank pain with UTI and leukocytosis - Blood cultures negative so far.  Urine culture negative so far. -Currently on cefepime.  Afebrile for more than 48 hours. -Feels better and wants to go home today.  Discharged home on cefadroxil for 5 more days.  Outpatient follow-up with PCP.   Severe abdominal pain with vomiting Possible acute esophagitis Sigmoid colon thickening -Imaging as above.  Currently on IV Protonix, oral Carafate and as needed oral Maalox.   -GI signed off on 11/20/2021: Outpatient follow-up with GI.  Sigmoid thickening was only seen in one CT scan on admission and was not shown on the other one. DC'd flagyl.  Might benefit from outpatient endoscopy and colonoscopy.  Outpatient follow-up with GI. -Tolerating soft diet.  Oral intake improving.  Off IV fluids.   -Continue oral Protonix, sucralfate on discharge.  Can use as needed Maalox as well.  Elevated LFTs -LFTs normal on presentation but elevated on 11/19/2021.  Questionable cause.  LFTs normalized today.  Right upper quadrant ultrasound was negative for any focal hepatic lesion.   Hyponatremia -Improved.   Oral ulcers -Questionable cause.  Oral pain much improving after starting Solu-Medrol on 11/21/2021.  Discharged home on prednisone 40 mg daily for 7 days.  Continue Orajel as needed.  We will also send home on viscous lidocaine.  Outpatient follow-up with PCP.  If symptoms do not improve, might need outpatient ENT/rheumatology evaluation and follow-up.    Hypokalemia -Improved   Leukocytosis -Resolved   Obesity -Outpatient follow-up  Discharge Instructions  Discharge Instructions     Diet - low sodium heart healthy   Complete by: As directed    Increase activity slowly   Complete by: As directed  Allergies as of 11/23/2021       Reactions   Banana Nausea And Vomiting   Cucumber Extract Nausea And Vomiting   Eggs Or Egg-derived Products Nausea  And Vomiting        Medication List     STOP taking these medications    acetaminophen 500 MG tablet Commonly known as: TYLENOL   amoxicillin 500 MG tablet Commonly known as: AMOXIL       TAKE these medications    amLODipine 5 MG tablet Commonly known as: NORVASC Take 5 mg by mouth daily.   cefadroxil 500 MG capsule Commonly known as: DURICEF Take 2 capsules (1,000 mg total) by mouth 2 (two) times daily for 5 days.   ibuprofen 200 MG tablet Commonly known as: ADVIL Take 600-800 mg by mouth every 6 (six) hours as needed for mild pain. For pain   lidocaine 2 % solution Commonly known as: XYLOCAINE Use as directed 15 mLs in the mouth or throat every 6 (six) hours as needed for mouth pain.   methocarbamol 500 MG tablet Commonly known as: ROBAXIN Take 1 tablet (500 mg total) by mouth every 6 (six) hours as needed for muscle spasms.   ondansetron 4 MG tablet Commonly known as: ZOFRAN Take 1 tablet (4 mg total) by mouth every 6 (six) hours as needed for nausea.   oxyCODONE 5 MG immediate release tablet Commonly known as: Oxy IR/ROXICODONE Take 1 tablet (5 mg total) by mouth every 6 (six) hours as needed for severe pain.   pantoprazole 40 MG tablet Commonly known as: Protonix Take 1 tablet (40 mg total) by mouth 2 (two) times daily before a meal. What changed:  medication strength how much to take when to take this   polyethylene glycol 17 g packet Commonly known as: MIRALAX / GLYCOLAX Take 17 g by mouth daily as needed for moderate constipation.   predniSONE 20 MG tablet Commonly known as: DELTASONE Take 2 tablets (40 mg total) by mouth daily with breakfast for 7 days.   sucralfate 1 GM/10ML suspension Commonly known as: CARAFATE Take 10 mLs (1 g total) by mouth 4 (four) times daily -  with meals and at bedtime.        Follow-up Information     PCP. Schedule an appointment as soon as possible for a visit in 1 week(s).          Danton Clap  H, DO Follow up in 2 week(s).   Specialty: Gastroenterology Contact information: [address] [place] [address] [phone]                Allergies  Allergen Reactions   Banana Nausea And Vomiting   Cucumber Extract Nausea And Vomiting   Eggs Or Egg-Derived Products Nausea And Vomiting    Consultations: GI   Procedures/Studies: US Abdomen Limited RUQ (LIVER/GB)  Result Date: 11/19/2021 CLINICAL DATA:  Elevated liver enzymes EXAM: ULTRASOUND ABDOMEN LIMITED RIGHT UPPER QUADRANT COMPARISON:  None Available. FINDINGS: Gallbladder: Surgically absent Common bile duct: Diameter: 8.4 mm Liver: No focal lesion identified. Within normal limits in parenchymal echogenicity. Portal vein is patent on color Doppler imaging with normal direction of blood flow towards the liver. Other: Small right pleural effusion. IMPRESSION: 1. Status post cholecystectomy. 2. No focal hepatic lesion. 3. Small right pleural effusion. Electronically Signed   By: Kathreen Devoid M.D.   On: 11/19/2021 10:50   CT Angio Chest/Abd/Pel for Dissection W and/or Wo Contrast  Result Date: 11/18/2021  CLINICAL DATA:  Chest pain or back pain, aortic dissection suspected. EXAM: CT ANGIOGRAPHY CHEST, ABDOMEN AND PELVIS TECHNIQUE: Non-contrast CT of the chest was initially obtained. Multidetector CT imaging through the chest, abdomen and pelvis was performed using the standard protocol during bolus administration of intravenous contrast. Multiplanar reconstructed images and MIPs were obtained and reviewed to evaluate the vascular anatomy. RADIATION DOSE REDUCTION: This exam was performed according to the departmental dose-optimization program which includes automated exposure control, adjustment of the mA and/or kV according to patient size and/or use of iterative reconstruction technique. CONTRAST:  67mL OMNIPAQUE IOHEXOL 350 MG/ML SOLN COMPARISON:  CT abdomen pelvis November 18, 2021. FINDINGS: CTA CHEST FINDINGS  Cardiovascular: Noncontrast enhanced CT of the chest does not demonstrate intramural hematoma. Preferential opacification of the thoracic aorta postcontrast administration without no evidence of thoracic aortic aneurysm or dissection. No central pulmonary embolus on this nondedicated study. Normal heart size. No pericardial effusion. Mediastinum/Nodes: No supraclavicular adenopathy. No suspicious thyroid nodule. No pathologically enlarged mediastinal, hilar or axillary lymph nodes. Mild symmetric esophageal wall thickening. Lungs/Pleura: Trace bilateral pleural effusions. Hypoventilatory change in the dependent lungs. No pneumothorax. Musculoskeletal: No acute osseous abnormality. Review of the MIP images confirms the above findings. CTA ABDOMEN AND PELVIS FINDINGS VASCULAR Aorta: Normal caliber aorta without aneurysm, dissection, vasculitis or significant stenosis. Celiac: Patent without evidence of aneurysm, dissection, vasculitis or significant stenosis. SMA: Patent without evidence of aneurysm, dissection, vasculitis or significant stenosis. Renals: Renal arteries are patent without evidence of aneurysm, dissection, vasculitis, fibromuscular dysplasia or significant stenosis. IMA: Patent without evidence of aneurysm, dissection, vasculitis or significant stenosis. Inflow: Patent without evidence of aneurysm, dissection, vasculitis or significant stenosis. Veins: No obvious venous abnormality within the limitations of this arterial phase study. Review of the MIP images confirms the above findings. NON-VASCULAR Hepatobiliary: No suspicious hepatic lesion. Similar prominence of the biliary tree post cholecystectomy. Pancreas: No pancreatic ductal dilation or evidence of acute inflammation. Spleen: No splenomegaly. Adrenals/Urinary Tract: Bilateral adrenal glands are within normal limits. No hydronephrosis. Excreted contrast material in the kidneys from prior contrast CT of the abdomen pelvis. 12 mm fluid density  left renal lesion is compatible with a cyst and considered benign requiring no independent imaging follow-up. Urinary bladder is unremarkable. Stomach/Bowel: Stomach is unremarkable for degree of distension. No pathologic dilation of small or large bowel. The appendix and terminal ileum appear normal. Moderate volume of formed stool in the colon. No evidence of acute bowel inflammation. Lymphatic: No pathologically enlarged abdominal or pelvic lymph nodes. Reproductive: Status post hysterectomy. No adnexal masses. Other: No significant abdominopelvic free fluid. Musculoskeletal: No acute osseous abnormality. Review of the MIP images confirms the above findings. IMPRESSION: 1. No evidence of aortic aneurysm or dissection. 2. Trace bilateral pleural effusions. 3. Mild symmetric esophageal wall thickening, suggestive of esophagitis. 4. Moderate volume of formed stool in the colon. Electronically Signed   By: Dahlia Bailiff M.D.   On: 11/18/2021 18:27   DG Chest 1 View  Result Date: 11/18/2021 CLINICAL DATA:  Sore throat since Sunday, decreased oral intake, chest pain EXAM: CHEST  1 VIEW COMPARISON:  05/19/2020 FINDINGS: Single frontal view of the chest demonstrates an unremarkable cardiac silhouette. No airspace disease, effusion, or pneumothorax. No acute bony abnormalities. IMPRESSION: 1. No acute intrathoracic process. Electronically Signed   By: Randa Ngo M.D.   On: 11/18/2021 18:00   CT ABDOMEN PELVIS W CONTRAST  Result Date: 11/18/2021 CLINICAL DATA:  Abdominal pain EXAM: CT ABDOMEN AND PELVIS WITH CONTRAST TECHNIQUE:  Multidetector CT imaging of the abdomen and pelvis was performed using the standard protocol following bolus administration of intravenous contrast. RADIATION DOSE REDUCTION: This exam was performed according to the departmental dose-optimization program which includes automated exposure control, adjustment of the mA and/or kV according to patient size and/or use of iterative  reconstruction technique. CONTRAST:  47mL OMNIPAQUE IOHEXOL 350 MG/ML SOLN COMPARISON:  05/19/2020 FINDINGS: Lower chest: Minimal bilateral pleural effusions are seen. There is no focal consolidation in the visualized lower lung fields. Hepatobiliary: No focal abnormalities are seen in liver. There is mild prominence of intrahepatic bile ducts. Distal common bile duct in the head of the pancreas measures 10 mm. Surgical clips are seen in gallbladder fossa. Pancreas: No focal abnormalities are seen. Spleen: Unremarkable. Adrenals/Urinary Tract: Adrenals are unremarkable. There is no hydronephrosis. There are no renal or ureteral stones. There are a few smooth marginated low-density lesions in both kidneys each measuring less than 12 mm. Urinary bladder is unremarkable. Stomach/Bowel: Stomach is not distended. Small bowel loops are not dilated. Appendix is not dilated. There is circumferential wall thickening in short 1.7 cm segment of sigmoid colon in image 81 of series 3. There is no pericolic stranding. Vascular/Lymphatic: Vascular structures are unremarkable. There are subcentimeter nodes in mesentery and retroperitoneum suggesting possible benign reactive hyperplasia. Reproductive: Uterus is not seen.  There are no adnexal masses. Other: There is no ascites or pneumoperitoneum. Umbilical hernia containing fat is seen. Musculoskeletal: No acute findings are seen. IMPRESSION: No acute findings are seen in abdomen and pelvis. There is no evidence of intestinal obstruction or pneumoperitoneum. Appendix is not dilated. There is no hydronephrosis. There is 1.7 cm short segment circumferential wall thickening in sigmoid colon. This may be due to incomplete distention or inflammatory or neoplastic process. Endoscopic correlation should be considered. Slight prominence of intrahepatic bile ducts may be due to previous cholecystectomy. Minimal bilateral pleural effusions. Bilateral renal cysts. Other findings as described  in the body of the report. Electronically Signed   By: Elmer Picker M.D.   On: 11/18/2021 17:22      Subjective: Patient seen and examined at bedside.  Feels ready to go home today.  Still has some oral ulcers and pain but manageable and tolerating diet.  Flank pain is improving.  No fever or vomiting reported.  Discharge Exam: Vitals:   11/22/21 2312 11/23/21 0602  BP: 133/76 (!) 151/81  Pulse: (!) 58 (!) 58  Resp: 18 18  Temp: 97.9 F (36.6 C) 98.1 F (36.7 C)  SpO2: 96% 95%    General: Pt is alert, awake, not in acute distress.  Currently on room air.  Oral ulcers present but improving Cardiovascular: Mild intermittent bradycardia present, S1/S2 + Respiratory: bilateral decreased breath sounds at bases Abdominal: Soft, obese, mild epigastric tenderness present, ND, bowel sounds + Extremities: Trace lower extremity edema present; no cyanosis    The results of significant diagnostics from this hospitalization (including imaging, microbiology, ancillary and laboratory) are listed below for reference.     Microbiology: Recent Results (from the past 240 hour(s))  Group A Strep by PCR     Status: None   Collection Time: 11/18/21  1:00 PM   Specimen: Urine, Clean Catch; Sterile Swab  Result Value Ref Range Status   Group A Strep by PCR NOT DETECTED NOT DETECTED Final    Comment: Performed at [facility], [address] [place], [place] [address]  Resp Panel by RT-PCR (Flu A&B, Covid) Anterior Nasal Swab  Status: None   Collection Time: 11/18/21  3:10 PM   Specimen: Anterior Nasal Swab  Result Value Ref Range Status   SARS Coronavirus 2 by RT PCR NEGATIVE NEGATIVE Final    Comment: (NOTE) SARS-CoV-2 target nucleic acids are NOT DETECTED.  The SARS-CoV-2 RNA is generally detectable in upper respiratory specimens during the acute phase of infection. The lowest concentration of SARS-CoV-2 viral copies this assay can detect is 138 copies/mL. A negative result  does not preclude SARS-Cov-2 infection and should not be used as the sole basis for treatment or other patient management decisions. A negative result may occur with  improper specimen collection/handling, submission of specimen other than nasopharyngeal swab, presence of viral mutation(s) within the areas targeted by this assay, and inadequate number of viral copies(<138 copies/mL). A negative result must be combined with clinical observations, patient history, and epidemiological information. The expected result is Negative.  Fact Sheet for Patients:  EntrepreneurPulse.com.au  Fact Sheet for Healthcare Providers:  IncredibleEmployment.be  This test is no t yet approved or cleared by the [place] FDA and  has been authorized for detection and/or diagnosis of SARS-CoV-2 by FDA under an Emergency Use Authorization (EUA). This EUA will remain  in effect (meaning this test can be used) for the duration of the COVID-19 declaration under Section 564(b)(1) of the Act, 21 U.S.C.section 360bbb-3(b)(1), unless the authorization is terminated  or revoked sooner.       Influenza A by PCR NEGATIVE NEGATIVE Final   Influenza B by PCR NEGATIVE NEGATIVE Final    Comment: (NOTE) The Xpert Xpress SARS-CoV-2/FLU/RSV plus assay is intended as an aid in the diagnosis of influenza from Nasopharyngeal swab specimens and should not be used as a sole basis for treatment. Nasal washings and aspirates are unacceptable for Xpert Xpress SARS-CoV-2/FLU/RSV testing.  Fact Sheet for Patients: EntrepreneurPulse.com.au  Fact Sheet for Healthcare Providers: IncredibleEmployment.be  This test is not yet approved or cleared by the [place] FDA and has been authorized for detection and/or diagnosis of SARS-CoV-2 by FDA under an Emergency Use Authorization (EUA). This EUA will remain in effect (meaning this test can be used) for  the duration of the COVID-19 declaration under Section 564(b)(1) of the Act, 21 U.S.C. section 360bbb-3(b)(1), unless the authorization is terminated or revoked.  Performed at [facility], [place] [address] [place], [place] [address]   Blood culture (routine x 2)     Status: None   Collection Time: 11/18/21  8:00 PM   Specimen: BLOOD RIGHT HAND  Result Value Ref Range Status   Specimen Description BLOOD RIGHT HAND  Final   Special Requests   Final    BOTTLES DRAWN AEROBIC AND ANAEROBIC Blood Culture results may not be optimal due to an inadequate volume of blood received in culture bottles   Culture   Final    NO GROWTH 5 DAYS Performed at [facility], [place] [address], [place], [place] [address]    Report Status 11/23/2021 FINAL  Final  Blood culture (routine x 2)     Status: None   Collection Time: 11/18/21  8:00 PM   Specimen: BLOOD RIGHT HAND  Result Value Ref Range Status   Specimen Description BLOOD RIGHT HAND  Final   Special Requests   Final    BOTTLES DRAWN AEROBIC AND ANAEROBIC Blood Culture results may not be optimal due to an inadequate volume of blood received in culture bottles   Culture   Final  NO GROWTH 5 DAYS Performed at [facility], [place] [address], [place], [place] [address]    Report Status 11/23/2021 FINAL  Final  Urine Culture     Status: None   Collection Time: 11/20/21  9:53 AM   Specimen: Urine, Clean Catch  Result Value Ref Range Status   Specimen Description URINE, CLEAN CATCH  Final   Special Requests NONE  Final   Culture   Final    NO GROWTH Performed at [facility], [place] [address] [place],  [address]    Report Status 11/21/2021 FINAL  Final     Labs: BNP (last 3 results) No results for input(s): "BNP" in the last 8760 hours. Basic Metabolic Panel: Recent Labs  Lab 11/19/21 0520 11/20/21 0335 11/21/21 0358 11/22/21 0455 11/23/21 0318  NA 133* 135 137 140 137  K 3.1* 3.7 4.3 4.2 3.6  CL 99 103  102 100 99  CO2 25 27 25 27 26   GLUCOSE 110* 94 97 127* 164*  BUN 9 6 6 9 16   CREATININE 0.71 0.74 0.64 0.63 0.69  CALCIUM 7.7* 7.6* 7.9* 9.1 8.5*  MG  --  1.9 2.0 2.3 2.2   Liver Function Tests: Recent Labs  Lab 11/19/21 0520 11/20/21 0335 11/21/21 0358 11/22/21 0455 11/23/21 0318  AST 256* 76* 31 22 18   ALT 175* 108* 71* 60* 44  ALKPHOS 201* 160* 124 122 98  BILITOT 1.0 0.5 0.5 0.6 0.6  PROT 6.2* 6.2* 6.0* 6.9 6.5  ALBUMIN 3.0* 3.0* 2.9* 3.2* 3.0*   Recent Labs  Lab 11/18/21 1730 11/19/21 1517  LIPASE 110* 43   No results for input(s): "AMMONIA" in the last 168 hours. CBC: Recent Labs  Lab 11/18/21 1307 11/18/21 2230 11/19/21 0520 11/20/21 0335 11/21/21 0358 11/22/21 0455 11/23/21 0318  WBC 12.6*   < > 6.7 5.9 4.7 5.5 8.8  NEUTROABS 10.4*  --   --  3.5 3.2 3.0 6.3  HGB 15.1*   < > 13.1 12.9 12.5 13.9 12.8  HCT 44.0   < > 36.7 37.3 37.4 40.0 36.8  MCV 90.0   < > 87.6 89.7 92.3 88.5 88.7  PLT 311   < > 252 237 222 293 319   < > = values in this interval not displayed.   Cardiac Enzymes: No results for input(s): "CKTOTAL", "CKMB", "CKMBINDEX", "TROPONINI" in the last 168 hours. BNP: Invalid input(s): "POCBNP" CBG: No results for input(s): "GLUCAP" in the last 168 hours. D-Dimer No results for input(s): "DDIMER" in the last 72 hours. Hgb A1c No results for input(s): "HGBA1C" in the last 72 hours. Lipid Profile No results for input(s): "CHOL", "HDL", "LDLCALC", "TRIG", "CHOLHDL", "LDLDIRECT" in the last 72 hours. Thyroid function studies No results for input(s): "TSH", "T4TOTAL", "T3FREE", "THYROIDAB" in the last 72 hours.  Invalid input(s): "FREET3" Anemia work up No results for input(s): "VITAMINB12", "FOLATE", "FERRITIN", "TIBC", "IRON", "RETICCTPCT" in the last 72 hours. Urinalysis    Component Value Date/Time   COLORURINE YELLOW 11/18/2021 1536   APPEARANCEUR CLOUDY (A) 11/18/2021 1536   LABSPEC >1.030 (H) 11/18/2021 1536   PHURINE 6.0  11/18/2021 1536   GLUCOSEU NEGATIVE 11/18/2021 1536   HGBUR MODERATE (A) 11/18/2021 1536   BILIRUBINUR SMALL (A) 11/18/2021 1536   BILIRUBINUR small (A) 11/18/2021 1204   KETONESUR >80 (A) 11/18/2021 1536   KETONESUR large (80) (A) 11/18/2021 1204   PROTEINUR 100 (A) 11/18/2021 1536   PROTEINUR =100 (A) 11/18/2021 1204   UROBILINOGEN 1.0  11/18/2021 1204   UROBILINOGEN 0.2 07/26/2006 0930   NITRITE NEGATIVE 11/18/2021 1536   NITRITE Negative 11/18/2021 1204   LEUKOCYTESUR MODERATE (A) 11/18/2021 1536   LEUKOCYTESUR Moderate (2+) (A) 11/18/2021 1204   Sepsis Labs Recent Labs  Lab 11/20/21 0335 11/21/21 0358 11/22/21 0455 11/23/21 0318  WBC 5.9 4.7 5.5 8.8   Microbiology Recent Results (from the past 240 hour(s))  Group A Strep by PCR     Status: None   Collection Time: 11/18/21  1:00 PM   Specimen: Urine, Clean Catch; Sterile Swab  Result Value Ref Range Status   Group A Strep by PCR NOT DETECTED NOT DETECTED Final    Comment: Performed at [facility], [place]. [address], [place], [place] [address]  Resp Panel by RT-PCR (Flu A&B, Covid) Anterior Nasal Swab     Status: None   Collection Time: 11/18/21  3:10 PM   Specimen: Anterior Nasal Swab  Result Value Ref Range Status   SARS Coronavirus 2 by RT PCR NEGATIVE NEGATIVE Final    Comment: (NOTE) SARS-CoV-2 target nucleic acids are NOT DETECTED.  The SARS-CoV-2 RNA is generally detectable in upper respiratory specimens during the acute phase of infection. The lowest concentration of SARS-CoV-2 viral copies this assay can detect is 138 copies/mL. A negative result does not preclude SARS-Cov-2 infection and should not be used as the sole basis for treatment or other patient management decisions. A negative result may occur with  improper specimen collection/handling, submission of specimen other than nasopharyngeal swab, presence of viral mutation(s) within the areas targeted by this assay, and inadequate number of  viral copies(<138 copies/mL). A negative result must be combined with clinical observations, patient history, and epidemiological information. The expected result is Negative.  Fact Sheet for Patients:  EntrepreneurPulse.com.au  Fact Sheet for Healthcare Providers:  IncredibleEmployment.be  This test is no t yet approved or cleared by the [place] FDA and  has been authorized for detection and/or diagnosis of SARS-CoV-2 by FDA under an Emergency Use Authorization (EUA). This EUA will remain  in effect (meaning this test can be used) for the duration of the COVID-19 declaration under Section 564(b)(1) of the Act, 21 U.S.C.section 360bbb-3(b)(1), unless the authorization is terminated  or revoked sooner.       Influenza A by PCR NEGATIVE NEGATIVE Final   Influenza B by PCR NEGATIVE NEGATIVE Final    Comment: (NOTE) The Xpert Xpress SARS-CoV-2/FLU/RSV plus assay is intended as an aid in the diagnosis of influenza from Nasopharyngeal swab specimens and should not be used as a sole basis for treatment. Nasal washings and aspirates are unacceptable for Xpert Xpress SARS-CoV-2/FLU/RSV testing.  Fact Sheet for Patients: EntrepreneurPulse.com.au  Fact Sheet for Healthcare Providers: IncredibleEmployment.be  This test is not yet approved or cleared by the [place] FDA and has been authorized for detection and/or diagnosis of SARS-CoV-2 by FDA under an Emergency Use Authorization (EUA). This EUA will remain in effect (meaning this test can be used) for the duration of the COVID-19 declaration under Section 564(b)(1) of the Act, 21 U.S.C. section 360bbb-3(b)(1), unless the authorization is terminated or revoked.  Performed at [facility], [place] [address] [place], [place] [address]   Blood culture (routine x 2)     Status: None   Collection Time: 11/18/21  8:00 PM   Specimen: BLOOD RIGHT HAND   Result Value Ref Range Status   Specimen Description BLOOD RIGHT HAND  Final   Special Requests  Final    BOTTLES DRAWN AEROBIC AND ANAEROBIC Blood Culture results may not be optimal due to an inadequate volume of blood received in culture bottles   Culture   Final    NO GROWTH 5 DAYS Performed at [facility], [place] [address], [place], [place] [address]    Report Status 11/23/2021 FINAL  Final  Blood culture (routine x 2)     Status: None   Collection Time: 11/18/21  8:00 PM   Specimen: BLOOD RIGHT HAND  Result Value Ref Range Status   Specimen Description BLOOD RIGHT HAND  Final   Special Requests   Final    BOTTLES DRAWN AEROBIC AND ANAEROBIC Blood Culture results may not be optimal due to an inadequate volume of blood received in culture bottles   Culture   Final    NO GROWTH 5 DAYS Performed at [facility], [place] [address] [place], [place] [address]    Report Status 11/23/2021 FINAL  Final  Urine Culture     Status: None   Collection Time: 11/20/21  9:53 AM   Specimen: Urine, Clean Catch  Result Value Ref Range Status   Specimen Description URINE, CLEAN CATCH  Final   Special Requests NONE  Final   Culture   Final    NO GROWTH Performed at [facility], [place] [address] [place], [place] [address]    Report Status 11/21/2021 FINAL  Final     Time coordinating discharge: 35 minutes  SIGNED:   Aline August, MD  [facility] 11/23/2021, 10:17 AM

## 2021-11-26 LAB — HSV DNA BY PCR (REFERENCE LAB)
HSV 1 DNA: POSITIVE — AB
HSV 2 DNA: NEGATIVE

## 2021-11-30 ENCOUNTER — Encounter: Payer: Self-pay | Admitting: Internal Medicine

## 2021-11-30 ENCOUNTER — Telehealth: Payer: Self-pay

## 2021-11-30 ENCOUNTER — Ambulatory Visit (INDEPENDENT_AMBULATORY_CARE_PROVIDER_SITE_OTHER): Payer: BC Managed Care – PPO | Admitting: Internal Medicine

## 2021-11-30 VITALS — BP 156/102 | HR 82 | Ht 68.0 in | Wt 211.7 lb

## 2021-11-30 DIAGNOSIS — I158 Other secondary hypertension: Secondary | ICD-10-CM

## 2021-11-30 DIAGNOSIS — B002 Herpesviral gingivostomatitis and pharyngotonsillitis: Secondary | ICD-10-CM

## 2021-11-30 DIAGNOSIS — K209 Esophagitis, unspecified without bleeding: Secondary | ICD-10-CM

## 2021-11-30 DIAGNOSIS — Z0000 Encounter for general adult medical examination without abnormal findings: Secondary | ICD-10-CM

## 2021-11-30 DIAGNOSIS — R030 Elevated blood-pressure reading, without diagnosis of hypertension: Secondary | ICD-10-CM

## 2021-11-30 DIAGNOSIS — K639 Disease of intestine, unspecified: Secondary | ICD-10-CM

## 2021-11-30 DIAGNOSIS — R7401 Elevation of levels of liver transaminase levels: Secondary | ICD-10-CM | POA: Diagnosis not present

## 2021-11-30 DIAGNOSIS — Z720 Tobacco use: Secondary | ICD-10-CM

## 2021-11-30 DIAGNOSIS — F17210 Nicotine dependence, cigarettes, uncomplicated: Secondary | ICD-10-CM

## 2021-11-30 DIAGNOSIS — R300 Dysuria: Secondary | ICD-10-CM | POA: Diagnosis not present

## 2021-11-30 MED ORDER — SUCRALFATE 1 GM/10ML PO SUSP: 1.0000 g | Freq: Three times a day (TID) | ORAL | 0 refills | Status: DC

## 2021-11-30 MED ORDER — AMLODIPINE BESYLATE 10 MG PO TABS: 10.0000 mg | ORAL_TABLET | Freq: Every day | ORAL | 11 refills | Status: DC

## 2021-11-30 MED ORDER — PHENAZOPYRIDINE HCL 100 MG PO TABS: 100.0000 mg | ORAL_TABLET | Freq: Three times a day (TID) | ORAL | 0 refills | Status: DC | PRN

## 2021-11-30 MED ORDER — PANTOPRAZOLE SODIUM 40 MG PO TBEC: 40.0000 mg | DELAYED_RELEASE_TABLET | Freq: Two times a day (BID) | ORAL | 0 refills | Status: DC

## 2021-11-30 NOTE — Telephone Encounter (Signed)
Incoming fax from pharmacy Carafate-"drug not covered"

## 2021-11-30 NOTE — Progress Notes (Unsigned)
Subjective:  CC: establish care and hospital follow-up  HPI:  Ms.[name] is a 46 y.o. female with a past medical history stated below and presents today for hospital follow-up. She presented with dysuria, hematuria, ulcers in her mouth and abdominal pain. CT Angio chest. Abd/ pelvis showed mild symmetric esophageal wall thickening suggestive of esophagitis with thickening in sigmoid colon. GI consulted in setting of transaminitis.  Autoimmune workup was completed and was negative.  She is feeling better since hospital discharge but continues to have pain in mouth, burning with urination, and pain in back. Please see problem based assessment and plan for additional details.  Past Medical History:  Diagnosis Date   Tobacco use disorder     Current Outpatient Medications on File Prior to Visit  Medication Sig Dispense Refill   ibuprofen (ADVIL,MOTRIN) 200 MG tablet Take 600-800 mg by mouth every 6 (six) hours as needed for mild pain. For pain     lidocaine (XYLOCAINE) 2 % solution Use as directed 15 mLs in the mouth or throat every 6 (six) hours as needed for mouth pain. 100 mL 0   methocarbamol (ROBAXIN) 500 MG tablet Take 1 tablet (500 mg total) by mouth every 6 (six) hours as needed for muscle spasms. 30 tablet 0   ondansetron (ZOFRAN) 4 MG tablet Take 1 tablet (4 mg total) by mouth every 6 (six) hours as needed for nausea. 20 tablet 0   polyethylene glycol (MIRALAX / GLYCOLAX) 17 g packet Take 17 g by mouth daily as needed for moderate constipation. 14 each 0   [DISCONTINUED] metoCLOPramide (REGLAN) 10 MG tablet Take 1 tablet (10 mg total) by mouth every 6 (six) hours. (Patient not taking: Reported on 05/19/2020) 15 tablet 0   No current facility-administered medications on file prior to visit.    Family History  Problem Relation Age of Onset   Hypertension Mother    Hypertension Maternal Grandmother    Cancer Maternal Grandmother    Cancer Maternal Grandfather     Hypertension Paternal Grandmother    Diabetes Paternal Grandmother     Social History   Socioeconomic History   Marital status: Married    Spouse name: Not on file   Number of children: Not on file   Years of education: Not on file   Highest education level: Not on file  Occupational History   Not on file  Tobacco Use   Smoking status: Every Day    Packs/day: 0.50    Years: 15.00    Total pack years: 7.50    Types: Cigarettes   Smokeless tobacco: Not on file  Substance and Sexual Activity   Alcohol use: Yes   Drug use: No   Sexual activity: Yes  Other Topics Concern   Not on file  Social History Narrative   Not on file   Social Determinants of Health   Financial Resource Strain: Not on file  Food Insecurity: No Food Insecurity (11/19/2021)   Hunger Vital Sign    Worried About Running Out of Food in the Last Year: Never true    Ran Out of Food in the Last Year: Never true  Transportation Needs: No Transportation Needs (11/19/2021)   PRAPARE - Administrator, Civil Service (Medical): No    Lack of Transportation (Non-Medical): No  Physical Activity: Not on file  Stress: Not on file  Social Connections: Not on file  Intimate Partner Violence: Not At Risk (11/19/2021)   Humiliation, Afraid, Rape,  and Kick questionnaire    Fear of Current or Ex-Partner: No    Emotionally Abused: No    Physically Abused: No    Sexually Abused: No    Review of Systems: ROS negative except for what is noted on the assessment and plan.  Objective:   Vitals:   11/30/21 1100  BP: (!) 156/102  Pulse: 82  SpO2: 100%  Weight: 211 lb 11.2 oz (96 kg)  Height: 5\' 8"  (1.727 m)    Physical Exam: Constitutional: well-appearing HEENT: erosions with erythematous base present on palate Cardiovascular: regular rate and rhythm, no m/r/g Pulmonary/Chest: normal work of breathing on room air, lungs clear to auscultation bilaterally Abdominal: soft, non-distended, mild tenderness to  left upper quadrant, no rebound Skin: warm and dry    Assessment & Plan:  No problem-specific Assessment & Plan notes found for this encounter.   Sigmoid wall thickening CTAbd/ pelvis showed esophagitis, 1.7 cm wall thickening of sigmoid. She was seen by Gi in hospital with recommendations for follow-up for endoscopy/ colonoscopy. She denies constipation. -f/u w GI  Esophagitis  Oral ulcers She was given 7 days of prednisone 40 mg for 7 days and orajel.  AI hepatitis, HSV, AMA  Patient discussed with Dr. [name], D.O. [facility]  PGY-2 Pager: [phone]  Phone: [phone] Date 12/01/2021  Time 6:05 PM

## 2021-11-30 NOTE — Patient Instructions (Signed)
Thank you, Ms.[name] for allowing [place] to provide your care today.   Burning with urination I am checking a urine today. Please try pyridium over the next few days. I will call with results.  Back pain The area that you are having pain is much lower than where your kidney is. On imaging there was not sign of kidney infection. Please try taking tylenol for you pain.   Esophagitis I have sent in refills of pantoprazole and sucralfate. Please follow-up with GI in 12/23.  Liver I am rechecking labs for this, I will call with results.  Congratulations on stopping smoking!   I have ordered the following labs for you:  Lab Orders         Urinalysis, Reflex Microscopic         CMP14 + Anion Gap      Referrals ordered today:   Referral Orders  No referral(s) requested today     I have ordered the following medication/changed the following medications:   Stop the following medications: Medications Discontinued During This Encounter  Medication Reason   oxyCODONE (OXY IR/ROXICODONE) 5 MG immediate release tablet    amLODipine (NORVASC) 5 MG tablet    sucralfate (CARAFATE) 1 GM/10ML suspension Reorder   pantoprazole (PROTONIX) 40 MG tablet Reorder     Start the following medications: Meds ordered this encounter  Medications   sucralfate (CARAFATE) 1 GM/10ML suspension    Sig: Take 10 mLs (1 g total) by mouth 4 (four) times daily -  with meals and at bedtime.    Dispense:  420 mL    Refill:  0   pantoprazole (PROTONIX) 40 MG tablet    Sig: Take 1 tablet (40 mg total) by mouth 2 (two) times daily before a meal.    Dispense:  60 tablet    Refill:  0   amLODipine (NORVASC) 10 MG tablet    Sig: Take 1 tablet (10 mg total) by mouth daily.    Dispense:  30 tablet    Refill:  11   phenazopyridine (PYRIDIUM) 100 MG tablet    Sig: Take 1 tablet (100 mg total) by mouth 3 (three) times daily as needed for pain.    Dispense:  10 tablet    Refill:  0     Follow up:  1  month  We look forward to seeing you next time. Please call our clinic at [phone] if you have any questions or concerns. The best time to call is Monday-Friday from 9am-4pm, but there is someone available 24/7. If after hours or the weekend, call the main hospital number and ask for the Internal Medicine Resident On-Call. If you need medication refills, please notify your pharmacy one week in advance and they will send [place] a request.   Thank you for trusting me with your care. Wishing you the best!   Rudene Christians, DO [facility]

## 2021-12-01 ENCOUNTER — Telehealth: Payer: Self-pay

## 2021-12-01 DIAGNOSIS — B002 Herpesviral gingivostomatitis and pharyngotonsillitis: Secondary | ICD-10-CM | POA: Insufficient documentation

## 2021-12-01 DIAGNOSIS — R7401 Elevation of levels of liver transaminase levels: Secondary | ICD-10-CM | POA: Insufficient documentation

## 2021-12-01 DIAGNOSIS — B001 Herpesviral vesicular dermatitis: Secondary | ICD-10-CM | POA: Insufficient documentation

## 2021-12-01 LAB — URINALYSIS, ROUTINE W REFLEX MICROSCOPIC
Bilirubin, UA: NEGATIVE
Glucose, UA: NEGATIVE
Ketones, UA: NEGATIVE
Leukocytes,UA: NEGATIVE
Nitrite, UA: NEGATIVE
Protein,UA: NEGATIVE
RBC, UA: NEGATIVE
Specific Gravity, UA: 1.012 (ref 1.005–1.030)
Urobilinogen, Ur: 0.2 mg/dL (ref 0.2–1.0)
pH, UA: >=9 — AB (ref 5.0–7.5)

## 2021-12-01 NOTE — Telephone Encounter (Signed)
Prior Authorization for patient (Pantoprazole) came through on cover my meds was submitted with last office notes awaiting approval or denial.

## 2021-12-02 ENCOUNTER — Ambulatory Visit: Payer: BC Managed Care – PPO

## 2021-12-02 DIAGNOSIS — R300 Dysuria: Secondary | ICD-10-CM | POA: Insufficient documentation

## 2021-12-02 DIAGNOSIS — K209 Esophagitis, unspecified without bleeding: Secondary | ICD-10-CM | POA: Insufficient documentation

## 2021-12-02 DIAGNOSIS — R030 Elevated blood-pressure reading, without diagnosis of hypertension: Secondary | ICD-10-CM | POA: Insufficient documentation

## 2021-12-02 DIAGNOSIS — Z0000 Encounter for general adult medical examination without abnormal findings: Secondary | ICD-10-CM | POA: Insufficient documentation

## 2021-12-02 DIAGNOSIS — K639 Disease of intestine, unspecified: Secondary | ICD-10-CM | POA: Insufficient documentation

## 2021-12-02 MED ORDER — OMEPRAZOLE MAGNESIUM 20 MG PO TBEC: 20.0000 mg | DELAYED_RELEASE_TABLET | Freq: Every day | ORAL | 1 refills | Status: DC

## 2021-12-02 NOTE — Assessment & Plan Note (Signed)
LFTs elevated to AST 256, ALT 175, and alkphos 201. In setting of oral ulcers there was concern for autoimmune process and GI consulted. Workup negativ for mitochondrial, ANA, anti smoth muscles. Hepatitis panel was also negative.  Her LFTs downtrended by hospital discharge, albumin remained low at 3.0, renal function wnl. On exam no edema present. A: Autoimmune work up negative. I am not sure of what caused transient elevation in liver enzymes. Infectious work-up was also negative. Lipase was elevated but no signs of pancreatitis on imaging. She is s/p cholestectomy. P: Repeat CMP  -patient did not stop at lab prior to leaving, I called and talked with her about lab only visit and order changed to future

## 2021-12-02 NOTE — Assessment & Plan Note (Addendum)
She presented to hospital with dysuria and back pain. She was treated for pyelonephritis and completed PO antibiotics a few days ago. During admission Ua moderate leukocytes, proteinuria, ketonuria, and hematuria. Her specific gravity also reflected concentration, negative for nitrites and +leukocytes. Urine culture was negative but this was 3 days after admission. Imaging did not reflect pyelonephritis. She continues to have dysuria and is concerned about back pain being related to kidney. On exam, pain is localized to lower back lateral to sacrum, no CVA tenderness present. A/P: Repeat UA negative for nitrites and leukocytes. I am unsure why she continue to have burning when she pees but no signs of infection.  I reassured her that pain was not in location that would be concerning for kidney infection. I think that pain is related to ergonomics and encouraged her to use tylenol as needed until pain improved. -pyridium 100mg  TID, 10 tabs

## 2021-12-02 NOTE — Assessment & Plan Note (Signed)
Blood pressure elevated to 156/102. She is in pain today as well as was upset about HSV result. -monitor at follow-up

## 2021-12-02 NOTE — Assessment & Plan Note (Signed)
She was given 7 days of prednisone 40 mg for 7 days and orajel. The ulcers are healing, but remain painful. HSV DNA PCR + for HSV1. This was the first time she has lesions and was not aware that the HSV test had been ordered in the hospital. We talked about diagnosis and indications for treatment if she has recurrence of symptoms.

## 2021-12-02 NOTE — Assessment & Plan Note (Signed)
She has not smoked since leaving the hospital and is trying to not start again.

## 2021-12-02 NOTE — Addendum Note (Signed)
Addended by: Lucille Passy on: 12/02/2021 01:32 PM   Modules accepted: Orders

## 2021-12-02 NOTE — Telephone Encounter (Signed)
Decision:Denied [facility]RIST[facility] [employer] - Rx #: [id number] Need help? Call us at [phone] Outcome Deniedtoday Your request has been denied Drug Pantoprazole Sodium 40MG  dr tablets Form Blue Form (CB) Original Claim Info

## 2021-12-02 NOTE — Assessment & Plan Note (Addendum)
CTAbd/ pelvis showed esophagitis, 1.7 cm wall thickening of sigmoid possibly due to incomplete distension, inflammatory process or neoplastic process. She was seen by GI in hospital with recommendations for follow-up for endoscopy/ colonoscopy. She denies nausea, vomiting, constipation, and weight loss. She does have tenderness with palpable to left upper quadrant, but no guarding. No findings on imaging to explain tenderness. -f/u with GI 12/8

## 2021-12-02 NOTE — Assessment & Plan Note (Addendum)
CTA chest showed mild symmetric esophageal wall thickening. She was not able to start PPI, but has been taking carafate. She denies acid reflux. Likely imaging findings were 2/2 to vomiting the days prior. -PPI -unable to refill carafate due to insurance  Addendum: PA denied for PPI prior to trial of OTC PPI. Omeprazole 20 mg sent to pharmacy

## 2021-12-02 NOTE — Assessment & Plan Note (Signed)
Follow-up in 4 weeks. Would consider further evaluation with lipid panel, A1c at follow-up.

## 2021-12-12 NOTE — Progress Notes (Signed)
Internal Medicine Clinic Attending  Case discussed with Dr. Masters  At the time of the visit.  We reviewed the resident's history and exam and pertinent patient test results.  I agree with the assessment, diagnosis, and plan of care documented in the resident's note.  

## 2022-05-16 ENCOUNTER — Encounter (HOSPITAL_COMMUNITY): Payer: Self-pay

## 2022-05-16 ENCOUNTER — Emergency Department (HOSPITAL_COMMUNITY)
Admission: EM | Admit: 2022-05-16 | Discharge: 2022-05-16 | Disposition: A | Discharge status: Home / Self Care | Payer: BC Managed Care – PPO | Attending: Emergency Medicine | Admitting: Emergency Medicine

## 2022-05-16 ENCOUNTER — Emergency Department (HOSPITAL_COMMUNITY): Payer: BC Managed Care – PPO

## 2022-05-16 DIAGNOSIS — R112 Nausea with vomiting, unspecified: Secondary | ICD-10-CM | POA: Insufficient documentation

## 2022-05-16 DIAGNOSIS — R109 Unspecified abdominal pain: Secondary | ICD-10-CM | POA: Diagnosis not present

## 2022-05-16 DIAGNOSIS — Z79899 Other long term (current) drug therapy: Secondary | ICD-10-CM | POA: Insufficient documentation

## 2022-05-16 DIAGNOSIS — D72829 Elevated white blood cell count, unspecified: Secondary | ICD-10-CM | POA: Insufficient documentation

## 2022-05-16 DIAGNOSIS — R197 Diarrhea, unspecified: Secondary | ICD-10-CM | POA: Diagnosis not present

## 2022-05-16 LAB — COMPREHENSIVE METABOLIC PANEL
ALT: 16 U/L (ref 0–44)
AST: 16 U/L (ref 15–41)
Albumin: 4.1 g/dL (ref 3.5–5.0)
Alkaline Phosphatase: 47 U/L (ref 38–126)
Anion gap: 9 (ref 5–15)
BUN: 16 mg/dL (ref 6–20)
CO2: 20 mmol/L — ABNORMAL LOW (ref 22–32)
Calcium: 8.7 mg/dL — ABNORMAL LOW (ref 8.9–10.3)
Chloride: 105 mmol/L (ref 98–111)
Creatinine, Ser: 0.64 mg/dL (ref 0.44–1.00)
GFR, Estimated: >60 mL/min (ref >60)
Glucose, Bld: 92 mg/dL (ref 70–99)
Potassium: 3.3 mmol/L — ABNORMAL LOW (ref 3.5–5.1)
Sodium: 134 mmol/L — ABNORMAL LOW (ref 135–145)
Total Bilirubin: 0.9 mg/dL (ref 0.3–1.2)
Total Protein: 7.7 g/dL (ref 6.5–8.1)

## 2022-05-16 LAB — CBC WITH DIFFERENTIAL/PLATELET
Abs Immature Granulocytes: 0.03 10*3/uL (ref 0.00–0.07)
Basophils Absolute: 0.1 10*3/uL (ref 0.0–0.1)
Basophils Relative: 1 %
Eosinophils Absolute: 0.2 10*3/uL (ref 0.0–0.5)
Eosinophils Relative: 1 %
HCT: 41.6 % (ref 36.0–46.0)
Hemoglobin: 14.7 g/dL (ref 12.0–15.0)
Immature Granulocytes: 0 %
Lymphocytes Relative: 31 %
Lymphs Abs: 3.6 10*3/uL (ref 0.7–4.0)
MCH: 31.2 pg (ref 26.0–34.0)
MCHC: 35.3 g/dL (ref 30.0–36.0)
MCV: 88.3 fL (ref 80.0–100.0)
Monocytes Absolute: 0.8 10*3/uL (ref 0.1–1.0)
Monocytes Relative: 7 %
Neutro Abs: 7.2 10*3/uL (ref 1.7–7.7)
Neutrophils Relative %: 60 %
Platelets: 361 10*3/uL (ref 150–400)
RBC: 4.71 MIL/uL (ref 3.87–5.11)
RDW: 11.8 % (ref 11.5–15.5)
WBC: 11.9 10*3/uL — ABNORMAL HIGH (ref 4.0–10.5)
nRBC: 0 % (ref 0.0–0.2)

## 2022-05-16 LAB — URINALYSIS, ROUTINE W REFLEX MICROSCOPIC
Bilirubin Urine: NEGATIVE
Glucose, UA: NEGATIVE mg/dL
Hgb urine dipstick: NEGATIVE
Ketones, ur: 5 mg/dL — AB
Leukocytes,Ua: NEGATIVE
Nitrite: NEGATIVE
Protein, ur: NEGATIVE mg/dL
Specific Gravity, Urine: 1.01 (ref 1.005–1.030)
pH: 7 (ref 5.0–8.0)

## 2022-05-16 LAB — LIPASE, BLOOD: Lipase: 25 U/L (ref 11–51)

## 2022-05-16 LAB — TROPONIN I (HIGH SENSITIVITY): Troponin I (High Sensitivity): 2 ng/L (ref <18)

## 2022-05-16 LAB — I-STAT BETA HCG BLOOD, ED (MC, WL, AP ONLY): I-stat hCG, quantitative: <5 m[IU]/mL (ref <5)

## 2022-05-16 LAB — LACTIC ACID, PLASMA: Lactic Acid, Venous: 1.3 mmol/L (ref 0.5–1.9)

## 2022-05-16 MED ORDER — DROPERIDOL 2.5 MG/ML IJ SOLN
1.2500 mg | Freq: Once | INTRAMUSCULAR | Status: AC
  Administered 2022-05-16: 1.25 mg via INTRAVENOUS
  Filled 2022-05-16: qty 2

## 2022-05-16 MED ORDER — HYDROMORPHONE HCL 1 MG/ML IJ SOLN
1.0000 mg | Freq: Once | INTRAMUSCULAR | Status: AC
  Administered 2022-05-16: 1 mg via INTRAVENOUS
  Filled 2022-05-16: qty 1

## 2022-05-16 MED ORDER — IOHEXOL 300 MG/ML  SOLN
100.0000 mL | Freq: Once | INTRAMUSCULAR | Status: AC | PRN
  Administered 2022-05-16: 100 mL via INTRAVENOUS

## 2022-05-16 MED ORDER — FAMOTIDINE IN NACL 20-0.9 MG/50ML-% IV SOLN
20.0000 mg | Freq: Once | INTRAVENOUS | Status: AC
  Administered 2022-05-16: 20 mg via INTRAVENOUS
  Filled 2022-05-16: qty 50

## 2022-05-16 MED ORDER — LACTATED RINGERS IV BOLUS
1000.0000 mL | Freq: Once | INTRAVENOUS | Status: AC
  Administered 2022-05-16: 1000 mL via INTRAVENOUS

## 2022-05-16 MED ORDER — LIDOCAINE VISCOUS HCL 2 % MT SOLN
15.0000 mL | Freq: Once | OROMUCOSAL | Status: AC
  Administered 2022-05-16: 15 mL via ORAL
  Filled 2022-05-16: qty 15

## 2022-05-16 MED ORDER — ONDANSETRON 8 MG PO TBDP: 8.0000 mg | ORAL_TABLET | Freq: Three times a day (TID) | ORAL | 0 refills | Status: DC | PRN

## 2022-05-16 MED ORDER — OXYCODONE-ACETAMINOPHEN 5-325 MG PO TABS: 1.0000 | ORAL_TABLET | Freq: Four times a day (QID) | ORAL | 0 refills | Status: DC | PRN

## 2022-05-16 MED ORDER — FENTANYL CITRATE PF 50 MCG/ML IJ SOSY
50.0000 ug | PREFILLED_SYRINGE | Freq: Once | INTRAMUSCULAR | Status: AC
  Administered 2022-05-16: 50 ug via INTRAVENOUS
  Filled 2022-05-16: qty 1

## 2022-05-16 MED ORDER — ONDANSETRON HCL 4 MG/2ML IJ SOLN
4.0000 mg | Freq: Once | INTRAMUSCULAR | Status: AC
  Administered 2022-05-16: 4 mg via INTRAVENOUS
  Filled 2022-05-16: qty 2

## 2022-05-16 MED ORDER — ALUM & MAG HYDROXIDE-SIMETH 200-200-20 MG/5ML PO SUSP
30.0000 mL | Freq: Once | ORAL | Status: AC
  Administered 2022-05-16: 30 mL via ORAL
  Filled 2022-05-16: qty 30

## 2022-05-16 MED ORDER — ALUM & MAG HYDROXIDE-SIMETH 200-200-20 MG/5ML PO SUSP
15.0000 mL | Freq: Once | ORAL | Status: AC
  Administered 2022-05-16: 15 mL via ORAL
  Filled 2022-05-16: qty 30

## 2022-05-16 NOTE — Discharge Instructions (Addendum)
You are seen today in the emergency department due to nausea, vomiting and diarrhea.  Your CT scan was reassuring, your laboratory workup was also reassuring today.  Take the Zofran every 8 hours as needed for nausea, take the Percocet for severe pain, try to take Motrin or Tylenol for breakthrough pain.  This should resolve over the next day or so, if the pain becomes more severe, new or concerning symptoms, inability to drink return to the ED for further evaluation.

## 2022-05-16 NOTE — ED Provider Notes (Signed)
[facility]ERGENC[facility] DEPARTMENT AT [facility] Provider Note   CSN: [id number] Arrival date & time: 05/16/22  1405     History  Chief Complaint  Patient presents with   Emesis   Diarrhea    [name] is a 47 y.o. female.   Emesis Associated symptoms: diarrhea   Diarrhea Associated symptoms: vomiting      Patient with medical history of 3 previous C-sections, abdominal hysterectomy, cholecystectomy presents to the emergency department due to abdominal pain, nausea and vomiting and diarrhea.  Symptoms started 48 hours ago, they have been pretty constant.  She is having green stool, no hematemesis.  Denies any chest pain or shortness of breath but does feel like she is burping more than normal.  The pain radiates to her sides bilaterally, no dysuria or hematuria.  Unable to tolerate any oral intake.  Home Medications Prior to Admission medications   Medication Sig Start Date End Date Taking? Authorizing Provider  ondansetron (ZOFRAN-ODT) 8 MG disintegrating tablet Take 1 tablet (8 mg total) by mouth every 8 (eight) hours as needed for nausea or vomiting. 05/16/22  Yes Theron Arista, PA-C  oxyCODONE-acetaminophen (PERCOCET/ROXICET) 5-325 MG tablet Take 1 tablet by mouth every 6 (six) hours as needed for severe pain. 05/16/22  Yes Theron Arista, PA-C  amLODipine (NORVASC) 10 MG tablet Take 1 tablet (10 mg total) by mouth daily. 11/30/21 11/30/22  Masters, Katie, DO  ibuprofen (ADVIL,MOTRIN) 200 MG tablet Take 600-800 mg by mouth every 6 (six) hours as needed for mild pain. For pain    [provider]  lidocaine (XYLOCAINE) 2 % solution Use as directed 15 mLs in the mouth or throat every 6 (six) hours as needed for mouth pain. 11/23/21   Glade Lloyd, MD  methocarbamol (ROBAXIN) 500 MG tablet Take 1 tablet (500 mg total) by mouth every 6 (six) hours as needed for muscle spasms. 11/23/21   Glade Lloyd, MD  omeprazole (PRILOSEC OTC) 20 MG tablet Take 1 tablet (20  mg total) by mouth daily. 12/02/21 12/02/22  Masters, Katie, DO  ondansetron (ZOFRAN) 4 MG tablet Take 1 tablet (4 mg total) by mouth every 6 (six) hours as needed for nausea. 11/23/21   Glade Lloyd, MD  phenazopyridine (PYRIDIUM) 100 MG tablet Take 1 tablet (100 mg total) by mouth 3 (three) times daily as needed for pain. 11/30/21   Masters, Katie, DO  polyethylene glycol (MIRALAX / GLYCOLAX) 17 g packet Take 17 g by mouth daily as needed for moderate constipation. 11/23/21   Glade Lloyd, MD  sucralfate (CARAFATE) 1 GM/10ML suspension Take 10 mLs (1 g total) by mouth 4 (four) times daily -  with meals and at bedtime. 11/30/21   Masters, Florentina Addison, DO  metoCLOPramide (REGLAN) 10 MG tablet Take 1 tablet (10 mg total) by mouth every 6 (six) hours. Patient not taking: Reported on 05/19/2020 03/21/11 05/19/20  Elpidio Anis, PA-C      Allergies    Banana, Cucumber extract, and Egg-derived products    Review of Systems   Review of Systems  Gastrointestinal:  Positive for diarrhea and vomiting.    Physical Exam Updated Vital Signs BP (!) 102/53   Pulse 78   Temp 98.5 F (36.9 C) (Oral)   Resp (!) 22   SpO2 97%  Physical Exam Vitals and nursing note reviewed. Exam conducted with a chaperone present.  Constitutional:      Appearance: Normal appearance.     Comments: Writhing around in the room, unable  to sit still  HENT:     Head: Normocephalic and atraumatic.  Eyes:     General: No scleral icterus.       Right eye: No discharge.        Left eye: No discharge.     Extraocular Movements: Extraocular movements intact.     Pupils: Pupils are equal, round, and reactive to light.  Cardiovascular:     Rate and Rhythm: Normal rate and regular rhythm.     Pulses: Normal pulses.     Heart sounds: Normal heart sounds.     No friction rub. No gallop.     Comments: Upper and lower extremity pulses are symmetric bilaterally Pulmonary:     Effort: Pulmonary effort is normal. No respiratory  distress.     Breath sounds: Normal breath sounds.  Abdominal:     General: Abdomen is flat. Bowel sounds are normal. There is no distension.     Palpations: Abdomen is soft.     Tenderness: There is abdominal tenderness.  Skin:    General: Skin is warm and dry.     Coloration: Skin is not jaundiced.  Neurological:     Mental Status: She is alert. Mental status is at baseline.     Coordination: Coordination normal.     ED Results / Procedures / Treatments   Labs (all labs ordered are listed, but only abnormal results are displayed) Labs Reviewed  CBC WITH DIFFERENTIAL/PLATELET - Abnormal; Notable for the following components:      Result Value   WBC 11.9 (*)    All other components within normal limits  COMPREHENSIVE METABOLIC PANEL - Abnormal; Notable for the following components:   Sodium 134 (*)    Potassium 3.3 (*)    CO2 20 (*)    Calcium 8.7 (*)    All other components within normal limits  URINALYSIS, ROUTINE W REFLEX MICROSCOPIC - Abnormal; Notable for the following components:   Ketones, ur 5 (*)    All other components within normal limits  LIPASE, BLOOD  LACTIC ACID, PLASMA  LACTIC ACID, PLASMA  I-STAT BETA HCG BLOOD, ED ([facility], [facility], [facility] ONLY)  TROPONIN I (HIGH SENSITIVITY)  TROPONIN I (HIGH SENSITIVITY)    EKG EKG Interpretation  Date/Time:  Monday May 16 2022 18:46:10 EDT Ventricular Rate:  71 PR Interval:  157 QRS Duration: 107 QT Interval:  406 QTC Calculation: 442 R Axis:   83 Text Interpretation: Sinus rhythm Confirmed by Linwood Dibbles [phone]) on 05/16/2022 6:48:47 PM  Radiology CT ABDOMEN PELVIS W CONTRAST  Result Date: 05/16/2022 CLINICAL DATA:  Abdominal pain EXAM: CT ABDOMEN AND PELVIS WITH CONTRAST TECHNIQUE: Multidetector CT imaging of the abdomen and pelvis was performed using the standard protocol following bolus administration of intravenous contrast. RADIATION DOSE REDUCTION: This exam was performed according to the departmental  dose-optimization program which includes automated exposure control, adjustment of the mA and/or kV according to patient size and/or use of iterative reconstruction technique. CONTRAST:  OMNIPAQUE IOHEXOL 300 MG/ML  SOLN COMPARISON:  None Available. FINDINGS: Lower chest: Mild lower lobe ground-glass opacities. Small solid pulmonary nodule of the right middle lobe measuring 3 mm. Hepatobiliary: No focal liver abnormality is seen. Status post cholecystectomy. Mild dilation of the intra and extrahepatic bile ducts. Common bile duct measures up to 12 mm. Pancreas: Unremarkable. No pancreatic ductal dilatation or surrounding inflammatory changes. Spleen: Normal in size without focal abnormality. Adrenals/Urinary Tract: Bilateral adrenal glands are unremarkable. No hydronephrosis or nephrolithiasis. Mildly hyperdense exophytic  cyst of the mid region of the left kidney measuring 1.7 cm on series 2, image 42. Bladder is unremarkable. Stomach/Bowel: Stomach is within normal limits. Appendix appears normal. No evidence of bowel wall thickening, distention, or inflammatory changes. Vascular/Lymphatic: No significant vascular findings are present. No enlarged abdominal or pelvic lymph nodes. Reproductive: No adnexal mass. Other: No abdominal wall hernia or abnormality. No abdominopelvic ascites. Musculoskeletal: No acute or significant osseous findings. IMPRESSION: 1. No acute findings in the abdomen or pelvis. 2. Mild dilation of the intra and extrahepatic bile ducts, likely due to prior cholecystectomy. Although if liver function tests are abnormal, recommend further evaluation with MRCP. 3. Mildly hyperdense exophytic cyst mid region of the left kidney, possibly a hemorrhagic cyst, recommend renal ultrasound for further evaluation. 4. Mild lower lobe ground-glass opacities, likely due to atelectasis. 5. Small solid pulmonary nodule of the right middle lobe measuring 3 mm. No follow-up needed if patient is  low-risk.This recommendation follows the consensus statement: Guidelines for Management of Incidental Pulmonary Nodules Detected on CT Images: From the Fleischner Society 2017; Radiology 2017; 284:228-243. Electronically Signed   By: Allegra Lai M.D.   On: 05/16/2022 18:21   DG Chest Portable 1 View  Result Date: 05/16/2022 CLINICAL DATA:  cp EXAM: PORTABLE CHEST 1 VIEW COMPARISON:  Chest x-ray 11/18/2021. FINDINGS: The heart and mediastinal contours are within normal limits. No focal consolidation. No pulmonary edema. No pleural effusion. No pneumothorax. No acute osseous abnormality. IMPRESSION: No active disease. Electronically Signed   By: Tish Frederickson M.D.   On: 05/16/2022 17:50    Procedures Procedures    Medications Ordered in ED Medications  ondansetron (ZOFRAN) injection 4 mg (4 mg Intravenous Given 05/16/22 1452)  lactated ringers bolus 1,000 mL (0 mLs Intravenous Stopped 05/16/22 1600)  fentaNYL (SUBLIMAZE) injection 50 mcg (50 mcg Intravenous Given 05/16/22 1452)  HYDROmorphone (DILAUDID) injection 1 mg (1 mg Intravenous Given 05/16/22 1534)  alum & mag hydroxide-simeth (MAALOX/MYLANTA) 200-200-20 MG/5ML suspension 15 mL (15 mLs Oral Given 05/16/22 1534)  HYDROmorphone (DILAUDID) injection 1 mg (1 mg Intravenous Given 05/16/22 1702)  alum & mag hydroxide-simeth (MAALOX/MYLANTA) 200-200-20 MG/5ML suspension 30 mL (30 mLs Oral Given 05/16/22 1703)    And  lidocaine (XYLOCAINE) 2 % viscous mouth solution 15 mL (15 mLs Oral Given 05/16/22 1703)  famotidine (PEPCID) IVPB 20 mg premix (0 mg Intravenous Stopped 05/16/22 1733)  droperidol (INAPSINE) 2.5 MG/ML injection 1.25 mg (1.25 mg Intravenous Given 05/16/22 1735)  iohexol (OMNIPAQUE) 300 MG/ML solution 100 mL (100 mLs Intravenous Contrast Given 05/16/22 1800)    ED Course/ Medical Decision Making/ A&P                             Medical Decision Making Amount and/or Complexity of Data Reviewed Labs: ordered. Radiology:  ordered.  Risk OTC drugs. Prescription drug management.   This is a 47 year old female presenting to the emergency department due to nausea, vomiting, diarrhea and abdominal pain.  Differential is broad and includes dehydration, AKI, electrolyte derangement, new onset DKA, enteritis, colitis, sepsis.  History provided by the patient and by chart review and patient's partner who is at bedside.  Laboratory workup initiated.  Patient is very exquisitely tender on exam diffusely so CT was ordered to evaluate for any acute intraabdominal process.  Laboratory workup ordered and viewed by myself.  Mild leukocytosis without shift.  CMP without gross electro derangement or AKI or transaminitis.  No UTI, lipase  within normal notes markers of pancreatitis.  Not an ectopic pregnancy.  Troponin checked given upper abdominal pain, negative even there is no EKG changes I do not think this is ACS.  I considered dissection but the pain is more abdominal without does radiate somewhat to the back she has symmetric pulses and I think less likely in the setting of diarrhea.  After droperidol, lactated ringer bolus, Maalox and Dilaudid patient's pain is controlled.  She feels improved on reevaluation.    CT abdomen is negative for any acute emergent process.  She does have some findings which we discussed and copy of the CT was provided.  Given the normal LFTs I do not think there is any emergent biliary process.  Return precautions were discussed with the patient verbalized understanding, will have her follow-up closely with her PCP.  Given negative CT, control pain and reassuring laboratory workup and vitals I do not think she needs admission or further evaluation at this time.        Final Clinical Impression(s) / ED Diagnoses Final diagnoses:  Nausea vomiting and diarrhea    Rx / DC Orders ED Discharge Orders          Ordered    ondansetron (ZOFRAN-ODT) 8 MG disintegrating tablet  Every 8 hours PRN         05/16/22 1958    oxyCODONE-acetaminophen (PERCOCET/ROXICET) 5-325 MG tablet  Every 6 hours PRN        05/16/22 1958              Theron Arista, Cordelia Poche 05/16/22 2126    Linwood Dibbles, MD 05/17/22 [phone]

## 2022-05-16 NOTE — ED Triage Notes (Addendum)
Pt c/o emesis and diarrhea since Friday. Pt has generalized abd cramping . Pt c/o bilateral flank pain as well. Pt states she has not been able to keep down food/water.  Pt has also been belching frequently

## 2022-07-14 ENCOUNTER — Encounter: Payer: Self-pay | Admitting: Nurse Practitioner

## 2022-07-14 ENCOUNTER — Ambulatory Visit: Payer: BC Managed Care – PPO | Admitting: Nurse Practitioner

## 2022-07-14 ENCOUNTER — Ambulatory Visit: Payer: BC Managed Care – PPO | Admitting: Family Medicine

## 2022-07-14 ENCOUNTER — Other Ambulatory Visit: Payer: Self-pay | Admitting: Nurse Practitioner

## 2022-07-14 VITALS — BP 142/68 | HR 76 | Temp 99.0°F | Ht 68.0 in | Wt 228.4 lb

## 2022-07-14 DIAGNOSIS — R399 Unspecified symptoms and signs involving the genitourinary system: Secondary | ICD-10-CM

## 2022-07-14 LAB — POC URINALSYSI DIPSTICK (AUTOMATED)
Bilirubin, UA: NEGATIVE
Glucose, UA: NEGATIVE
Ketones, UA: NEGATIVE
Leukocytes, UA: NEGATIVE
Nitrite, UA: NEGATIVE
Protein, UA: NEGATIVE
Spec Grav, UA: >=1.03 — AB (ref 1.010–1.025)
Urobilinogen, UA: 0.2 E.U./dL
pH, UA: 6 (ref 5.0–8.0)

## 2022-07-14 MED ORDER — CIPROFLOXACIN HCL 500 MG PO TABS
500.0000 mg | ORAL_TABLET | Freq: Two times a day (BID) | ORAL | 0 refills | Status: AC
Start: 2022-07-14 — End: 2022-07-21

## 2022-07-14 NOTE — Progress Notes (Signed)
Bethanie Dicker, NP-C Phone: [phone]  [name] is a 47 y.o. female who presents today to establish care and for UTI symptoms.   Patient with symptoms x 6 days. The first 3 days she reports having dysuria which she took OTC Azo for and it resolved. She has felt nauseous. She has had bilateral back pain, a dull aching type pain. She is concerned her UTI has moved to her kidneys, as she was hospitalized with Pyelonephritis and sepsis in November. Denies any history of kidney stones.  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   Esophagitis 12/02/2021   Sigmoid thickening 12/02/2021   Elevated blood pressure reading 12/02/2021   Healthcare maintenance 12/02/2021   Dysuria 12/02/2021   UTI symptoms 07/20/2022   Resolved Ambulatory Problems    Diagnosis Date Noted   Abdominal pain, unspecified site 05/16/2008   Acute pyelonephritis 11/18/2021   Past Medical History:  Diagnosis Date   Allergy Couple years ago   Diabetes mellitus without complication (HCC) During pregnancies   GERD (gastroesophageal reflux disease) 1 year   Hypertension About a year   Tobacco use disorder     Family History  Problem Relation Age of Onset   Hypertension Mother    Cancer Mother    Hypertension Maternal Grandmother    Cancer Maternal Grandmother    Cancer Maternal Grandfather    Hypertension Paternal Grandmother    Diabetes Paternal Grandmother     Social History   Socioeconomic History   Marital status: Married    Spouse name: Not on file   Number of children: Not on file   Years of education: Not on file   Highest education level: Not on file  Occupational History   Not on file  Tobacco Use   Smoking status: Every Day     Packs/day: 0.50    Years: 15.00    Additional pack years: 0.00    Total pack years: 7.50    Types: Cigarettes   Smokeless tobacco: Not on file   Tobacco comments:    Soon, but not yet  Substance and Sexual Activity   Alcohol use: Yes    Comment: Every once in a blue moon   Drug use: No   Sexual activity: Yes  Other Topics Concern   Not on file  Social History Narrative   Not on file   Social Determinants of Health   Financial Resource Strain: Not on file  Food Insecurity: No Food Insecurity (11/19/2021)   Hunger Vital Sign    Worried About Running Out of Food in the Last Year: Never true    Ran Out of Food in the Last Year: Never true  Transportation Needs: No Transportation Needs (11/19/2021)   PRAPARE - Administrator, Civil Service (Medical): No    Lack of Transportation (Non-Medical): No  Physical Activity: Not on file  Stress: Not on file  Social Connections: Not on file  Intimate Partner Violence: Not At Risk (11/19/2021)   Humiliation, Afraid, Rape, and Kick questionnaire    Fear of Current or Ex-Partner: No    Emotionally Abused: No    Physically Abused: No    Sexually Abused:  No    ROS  General:  Negative for unexplained weight loss Skin: Negative for new or changing mole, sore that won't heal HEENT: Negative for trouble hearing, trouble seeing, ringing in ears, mouth sores, hoarseness, change in voice, dysphagia. CV:  Negative for chest pain, dyspnea, edema, palpitations Resp: Negative for cough, dyspnea, hemoptysis GI: Negative for diarrhea, constipation, abdominal pain, melena, hematochezia. GU: Negative for incontinence, hematuria, vaginal or penile discharge, polyuria, sexual difficulty, lumps in testicle or breasts MSK: Negative for muscle cramps or aches, joint pain or swelling Neuro: Negative for headaches, weakness, numbness, dizziness, passing out/fainting Psych: Negative for depression, anxiety, memory  problems  Objective  Physical Exam Vitals:   07/14/22 1552  BP: (!) 142/68  Pulse: 76  Temp: 99 F (37.2 C)  SpO2: 98%    BP Readings from Last 3 Encounters:  07/19/22 120/68  07/14/22 (!) 142/68  05/16/22 (!) 102/53   Wt Readings from Last 3 Encounters:  07/19/22 230 lb 3.2 oz (104.4 kg)  07/14/22 228 lb 6.4 oz (103.6 kg)  11/30/21 211 lb 11.2 oz (96 kg)    Physical Exam Constitutional:      General: She is not in acute distress.    Appearance: Normal appearance.  HENT:     Head: Normocephalic.  Cardiovascular:     Rate and Rhythm: Normal rate and regular rhythm.     Heart sounds: Normal heart sounds.  Pulmonary:     Effort: Pulmonary effort is normal.     Breath sounds: Normal breath sounds.  Abdominal:     General: Abdomen is flat. Bowel sounds are normal.     Palpations: Abdomen is soft.  Skin:    General: Skin is warm and dry.  Neurological:     General: No focal deficit present.     Mental Status: She is alert.  Psychiatric:        Mood and Affect: Mood normal.        Behavior: Behavior normal.    Assessment/Plan:   UTI symptoms Assessment & Plan: Concern for Pyelo or kidney stone given history, exam and significant back pain. Patient visibly uncomfortable. UA with trace blood only. Microscopic and culture pending. Discussed CT scan for further evaluation, patient politely declined due to cost. She would like to try antibiotics first to see if her symptoms resolve before proceeding with imaging. Will treat with Cipro 500 mg BID x 7 days. Encouraged adequate fluid intake. Will check lab work as outlined. Strict precautions given to patient.    Orders: -     POCT Urinalysis Dipstick (Automated) -     Urine Culture -     Ciprofloxacin HCl; Take 1 tablet (500 mg total) by mouth 2 (two) times daily for 7 days.  Dispense: 14 tablet; Refill: 0 -     CBC with Differential/Platelet -     Basic metabolic panel -     Urinalysis, Routine w reflex microscopic;  Future    Return if symptoms worsen or fail to improve, for Annual Exam.   Bethanie Dicker, NP-C [facility] - [employer]

## 2022-07-15 LAB — CBC WITH DIFFERENTIAL/PLATELET
Basophils Absolute: 0.1 10*3/uL (ref 0.0–0.1)
Basophils Relative: 0.8 % (ref 0.0–3.0)
Eosinophils Absolute: 0.7 10*3/uL (ref 0.0–0.7)
Eosinophils Relative: 5.6 % — ABNORMAL HIGH (ref 0.0–5.0)
HCT: 44 % (ref 36.0–46.0)
Hemoglobin: 14.5 g/dL (ref 12.0–15.0)
Lymphocytes Relative: 24.6 % (ref 12.0–46.0)
Lymphs Abs: 2.9 10*3/uL (ref 0.7–4.0)
MCHC: 33 g/dL (ref 30.0–36.0)
MCV: 91.3 fl (ref 78.0–100.0)
Monocytes Absolute: 0.8 10*3/uL (ref 0.1–1.0)
Monocytes Relative: 6.5 % (ref 3.0–12.0)
Neutro Abs: 7.4 10*3/uL (ref 1.4–7.7)
Neutrophils Relative %: 62.5 % (ref 43.0–77.0)
Platelets: 348 10*3/uL (ref 150.0–400.0)
RBC: 4.82 Mil/uL (ref 3.87–5.11)
RDW: 12.7 % (ref 11.5–15.5)
WBC: 11.9 10*3/uL — ABNORMAL HIGH (ref 4.0–10.5)

## 2022-07-15 LAB — BASIC METABOLIC PANEL
BUN: 14 mg/dL (ref 6–23)
CO2: 28 mEq/L (ref 19–32)
Calcium: 9.4 mg/dL (ref 8.4–10.5)
Chloride: 103 mEq/L (ref 96–112)
Creatinine, Ser: 0.73 mg/dL (ref 0.40–1.20)
GFR: 97.59 mL/min (ref >60.00)
Glucose, Bld: 86 mg/dL (ref 70–99)
Potassium: 4.1 mEq/L (ref 3.5–5.1)
Sodium: 139 mEq/L (ref 135–145)

## 2022-07-15 LAB — TIQ-MISC

## 2022-07-15 LAB — URINE CULTURE
MICRO NUMBER:: 15135619
SPECIMEN QUALITY:: ADEQUATE

## 2022-07-15 LAB — HOUSE ACCOUNT TRACKING

## 2022-07-18 ENCOUNTER — Telehealth: Payer: Self-pay | Admitting: Nurse Practitioner

## 2022-07-18 NOTE — Telephone Encounter (Signed)
Sending info to the Doc of the Day (ARNETT) & PCP  Spoke with pt she states a lump ion the left side of her neck developed yesterday, she states it is painful to touch. She states she has a headache/pain all down the back of her neck. She denies: chest pain, SOB, or dizziness. She states she has atleast one more day left of the cipro.   After speaking with the Doc of the day it was advised that pt be seen a lot sooner than wait until tomorrow. Pt was advised that we have an urgent care across the street. Pt stated that she does not want to pay an arm and a leg because of her insurance. Pt stated she would like to be scheduled with [place]. I informed pt that it would probably be best to be seen early in the am due to urgency and discomfort on her end. Pt was informed no one had an early morning openings. Pt was fine with being scheduled with Bethanie Dicker, NP on tomorrow at 2 pm.   Pt stated she will call back if she feels she can not wait and go to the urgernt care.

## 2022-07-18 NOTE — Telephone Encounter (Signed)
Pt called in stating that she saw Konrad Dolores last week, and she still not feeling better. Pt stated that she has some limps in her neck maybe for the same reason that she was seen for. Pt wants to know what can she do for now? She will like for [facility] nurse to call her. Pt also like to get her results from last week.

## 2022-07-19 ENCOUNTER — Ambulatory Visit: Payer: BC Managed Care – PPO | Admitting: Nurse Practitioner

## 2022-07-19 ENCOUNTER — Encounter: Payer: Self-pay | Admitting: Nurse Practitioner

## 2022-07-19 VITALS — BP 120/68 | HR 83 | Temp 98.6°F | Ht 68.0 in | Wt 230.2 lb

## 2022-07-19 DIAGNOSIS — R590 Localized enlarged lymph nodes: Secondary | ICD-10-CM

## 2022-07-19 NOTE — Progress Notes (Signed)
Angel Dicker, NP-C Phone: [phone]  [name] is a 47 y.o. female who presents today for enlarged lymph node.  Patient with enlarged cervical lymph node on left side of neck. She noticed it 3 days ago. The area is tender. She denies any fevers or chills. Denies sore throat or any recent illness. She is currently on Cipro 500 mg BID.   Social History   Tobacco Use  Smoking Status Every Day   Packs/day: 0.50   Years: 15.00   Additional pack years: 0.00   Total pack years: 7.50   Types: Cigarettes  Smokeless Tobacco Not on file  Tobacco Comments   Soon, but not yet    Current Outpatient Medications on File Prior to Visit  Medication Sig Dispense Refill   amLODipine (NORVASC) 10 MG tablet Take 1 tablet (10 mg total) by mouth daily. 30 tablet 11   ibuprofen (ADVIL,MOTRIN) 200 MG tablet Take 600-800 mg by mouth every 6 (six) hours as needed for mild pain. For pain     omeprazole (PRILOSEC OTC) 20 MG tablet Take 1 tablet (20 mg total) by mouth daily. 28 tablet 1   ondansetron (ZOFRAN-ODT) 8 MG disintegrating tablet Take 1 tablet (8 mg total) by mouth every 8 (eight) hours as needed for nausea or vomiting. (Patient not taking: Reported on 07/14/2022) 20 tablet 0   polyethylene glycol (MIRALAX / GLYCOLAX) 17 g packet Take 17 g by mouth daily as needed for moderate constipation. 14 each 0   sucralfate (CARAFATE) 1 GM/10ML suspension Take 10 mLs (1 g total) by mouth 4 (four) times daily -  with meals and at bedtime. 420 mL 0   [DISCONTINUED] metoCLOPramide (REGLAN) 10 MG tablet Take 1 tablet (10 mg total) by mouth every 6 (six) hours. (Patient not taking: Reported on 05/19/2020) 15 tablet 0   No current facility-administered medications on file prior to visit.    ROS see history of present illness  Objective  Physical Exam Vitals:   07/19/22 1411  BP: 120/68  Pulse: 83  Temp: 98.6 F (37 C)  SpO2: 98%    BP Readings from Last 3 Encounters:  07/19/22 120/68  07/14/22  (!) 142/68  05/16/22 (!) 102/53   Wt Readings from Last 3 Encounters:  07/19/22 230 lb 3.2 oz (104.4 kg)  07/14/22 228 lb 6.4 oz (103.6 kg)  11/30/21 211 lb 11.2 oz (96 kg)    Physical Exam Constitutional:      General: She is not in acute distress.    Appearance: Normal appearance.  HENT:     Head: Normocephalic.  Cardiovascular:     Rate and Rhythm: Normal rate and regular rhythm.     Heart sounds: Normal heart sounds.  Pulmonary:     Effort: Pulmonary effort is normal.     Breath sounds: Normal breath sounds.  Lymphadenopathy:     Cervical: Cervical adenopathy present.     Left cervical: Deep cervical adenopathy (tender on palpation) present.  Skin:    General: Skin is warm and dry.  Neurological:     General: No focal deficit present.     Mental Status: She is alert.  Psychiatric:        Mood and Affect: Mood normal.        Behavior: Behavior normal.    Assessment/Plan: Please see individual problem list.  Cervical lymphadenopathy Assessment & Plan: Left deep cervical adenopathy noted on exam. Tenderness present. Will check CBC today. She will follow up as previously scheduled in  13 days. She will contact prior to then if the area increases in size or she has increased pain. Will proceed with imaging if worsening, continues to be present at follow up or if CBC more elevated. Will continue to monitor.   Orders: -     CBC with Differential/Platelet   Return in 13 days (on 08/01/2022) for as scheduled.   Angel Dicker, NP-C [facility] - [employer]

## 2022-07-19 NOTE — Telephone Encounter (Signed)
Seen today. 

## 2022-07-20 ENCOUNTER — Encounter: Payer: Self-pay | Admitting: Nurse Practitioner

## 2022-07-20 DIAGNOSIS — R399 Unspecified symptoms and signs involving the genitourinary system: Secondary | ICD-10-CM | POA: Insufficient documentation

## 2022-07-20 LAB — CBC WITH DIFFERENTIAL/PLATELET
Basophils Absolute: 0.1 10*3/uL (ref 0.0–0.1)
Basophils Relative: 1.2 % (ref 0.0–3.0)
Eosinophils Absolute: 0.4 10*3/uL (ref 0.0–0.7)
Eosinophils Relative: 3.4 % (ref 0.0–5.0)
HCT: 40.9 % (ref 36.0–46.0)
Hemoglobin: 13.7 g/dL (ref 12.0–15.0)
Lymphocytes Relative: 24.7 % (ref 12.0–46.0)
Lymphs Abs: 2.7 10*3/uL (ref 0.7–4.0)
MCHC: 33.6 g/dL (ref 30.0–36.0)
MCV: 91.2 fl (ref 78.0–100.0)
Monocytes Absolute: 0.6 10*3/uL (ref 0.1–1.0)
Monocytes Relative: 5.6 % (ref 3.0–12.0)
Neutro Abs: 7.2 10*3/uL (ref 1.4–7.7)
Neutrophils Relative %: 65.1 % (ref 43.0–77.0)
Platelets: 369 10*3/uL (ref 150.0–400.0)
RBC: 4.49 Mil/uL (ref 3.87–5.11)
RDW: 12.7 % (ref 11.5–15.5)
WBC: 11 10*3/uL — ABNORMAL HIGH (ref 4.0–10.5)

## 2022-07-20 NOTE — Assessment & Plan Note (Signed)
Concern for Pyelo or kidney stone given history, exam and significant back pain. Patient visibly uncomfortable. UA with trace blood only. Microscopic and culture pending. Discussed CT scan for further evaluation, patient politely declined due to cost. She would like to try antibiotics first to see if her symptoms resolve before proceeding with imaging. Will treat with Cipro 500 mg BID x 7 days. Encouraged adequate fluid intake. Will check lab work as outlined. Strict precautions given to patient.

## 2022-07-26 DIAGNOSIS — R590 Localized enlarged lymph nodes: Secondary | ICD-10-CM | POA: Insufficient documentation

## 2022-07-26 NOTE — Assessment & Plan Note (Signed)
Left deep cervical adenopathy noted on exam. Tenderness present. Will check CBC today. She will follow up as previously scheduled in 13 days. She will contact prior to then if the area increases in size or she has increased pain. Will proceed with imaging if worsening, continues to be present at follow up or if CBC more elevated. Will continue to monitor.

## 2022-07-28 ENCOUNTER — Ambulatory Visit: Payer: BC Managed Care – PPO | Admitting: Nurse Practitioner

## 2022-08-01 ENCOUNTER — Encounter: Payer: Self-pay | Admitting: Nurse Practitioner

## 2022-08-01 ENCOUNTER — Ambulatory Visit (INDEPENDENT_AMBULATORY_CARE_PROVIDER_SITE_OTHER): Payer: BC Managed Care – PPO | Admitting: Nurse Practitioner

## 2022-08-01 VITALS — BP 126/84 | HR 85 | Temp 98.3°F | Ht 68.0 in | Wt 226.8 lb

## 2022-08-01 DIAGNOSIS — E669 Obesity, unspecified: Secondary | ICD-10-CM | POA: Diagnosis not present

## 2022-08-01 DIAGNOSIS — Z13220 Encounter for screening for lipoid disorders: Secondary | ICD-10-CM | POA: Diagnosis not present

## 2022-08-01 DIAGNOSIS — Z1329 Encounter for screening for other suspected endocrine disorder: Secondary | ICD-10-CM | POA: Diagnosis not present

## 2022-08-01 DIAGNOSIS — Z720 Tobacco use: Secondary | ICD-10-CM

## 2022-08-01 DIAGNOSIS — I10 Essential (primary) hypertension: Secondary | ICD-10-CM | POA: Diagnosis not present

## 2022-08-01 DIAGNOSIS — R590 Localized enlarged lymph nodes: Secondary | ICD-10-CM

## 2022-08-01 DIAGNOSIS — Z0000 Encounter for general adult medical examination without abnormal findings: Secondary | ICD-10-CM | POA: Diagnosis not present

## 2022-08-01 DIAGNOSIS — Z1211 Encounter for screening for malignant neoplasm of colon: Secondary | ICD-10-CM

## 2022-08-01 LAB — CBC WITH DIFFERENTIAL/PLATELET
Basophils Absolute: 0.1 10*3/uL (ref 0.0–0.1)
Basophils Relative: 0.9 % (ref 0.0–3.0)
Eosinophils Absolute: 0.4 10*3/uL (ref 0.0–0.7)
Eosinophils Relative: 3.1 % (ref 0.0–5.0)
HCT: 42.3 % (ref 36.0–46.0)
Hemoglobin: 14.5 g/dL (ref 12.0–15.0)
Lymphocytes Relative: 24.1 % (ref 12.0–46.0)
Lymphs Abs: 2.8 10*3/uL (ref 0.7–4.0)
MCHC: 34.3 g/dL (ref 30.0–36.0)
MCV: 90.8 fl (ref 78.0–100.0)
Monocytes Absolute: 0.7 10*3/uL (ref 0.1–1.0)
Monocytes Relative: 6.2 % (ref 3.0–12.0)
Neutro Abs: 7.5 10*3/uL (ref 1.4–7.7)
Neutrophils Relative %: 65.7 % (ref 43.0–77.0)
Platelets: 373 10*3/uL (ref 150.0–400.0)
RBC: 4.66 Mil/uL (ref 3.87–5.11)
RDW: 12.8 % (ref 11.5–15.5)
WBC: 11.5 10*3/uL — ABNORMAL HIGH (ref 4.0–10.5)

## 2022-08-01 LAB — COMPREHENSIVE METABOLIC PANEL
ALT: 13 U/L (ref 0–35)
AST: 17 U/L (ref 0–37)
Albumin: 4.5 g/dL (ref 3.5–5.2)
Alkaline Phosphatase: 38 U/L — ABNORMAL LOW (ref 39–117)
BUN: 20 mg/dL (ref 6–23)
CO2: 24 mEq/L (ref 19–32)
Calcium: 9.8 mg/dL (ref 8.4–10.5)
Chloride: 103 mEq/L (ref 96–112)
Creatinine, Ser: 0.72 mg/dL (ref 0.40–1.20)
GFR: 99.18 mL/min (ref >60.00)
Glucose, Bld: 91 mg/dL (ref 70–99)
Potassium: 4.1 mEq/L (ref 3.5–5.1)
Sodium: 137 mEq/L (ref 135–145)
Total Bilirubin: 0.6 mg/dL (ref 0.2–1.2)
Total Protein: 7.9 g/dL (ref 6.0–8.3)

## 2022-08-01 LAB — LIPID PANEL
Cholesterol: 174 mg/dL (ref 0–200)
HDL: 35.2 mg/dL — ABNORMAL LOW (ref >39.00)
LDL Cholesterol: 124 mg/dL — ABNORMAL HIGH (ref 0–99)
NonHDL: 139.16
Total CHOL/HDL Ratio: 5
Triglycerides: 77 mg/dL (ref 0.0–149.0)
VLDL: 15.4 mg/dL (ref 0.0–40.0)

## 2022-08-01 LAB — HEMOGLOBIN A1C: Hgb A1c MFr Bld: 5.8 % (ref 4.6–6.5)

## 2022-08-01 LAB — VITAMIN D 25 HYDROXY (VIT D DEFICIENCY, FRACTURES): VITD: 20.6 ng/mL — ABNORMAL LOW (ref 30.00–100.00)

## 2022-08-01 LAB — TSH: TSH: 1.3 u[IU]/mL (ref 0.35–5.50)

## 2022-08-01 NOTE — Progress Notes (Unsigned)
Bethanie Dicker, NP-C Phone: [phone]  [name] is a 47 y.o. female who presents today for annual exam. She is interested in something to help with weight loss. She was evaluated approximately 2 weeks ago for an enlarged cervical lymph node that she reports has resolved. Denies pain. Denies fever or chills.   Diet: She has been trying to increase her protein intake and decrease sugars. She has been doing more meal planning/prepping.  Exercise: None Pap smear: 09/01/2021 Colonoscopy: Never- Due! Mammogram: 09/01/2021- Due in August Family history-  Colon cancer: No  Breast cancer: Yes, maternal grandmother  Ovarian cancer: No Menses: Hx partial hysterectomy Sexually active: Yes Vaccines-   Flu: Not due  Tetanus: Unsure, declined today  COVID19: Never HIV screening: Negative Hep C Screening: Negative Tobacco use: Yes- smoking 1/2-1 pack per day, she is not interested in quitting at this time Alcohol use: Yes- rarely, special occasions only Illicit Drug use: No Dentist: Yes Ophthalmology: Yes   Social History   Tobacco Use  Smoking Status Every Day   Current packs/day: 0.50   Average packs/day: 0.5 packs/day for 15.0 years (7.5 ttl pk-yrs)   Types: Cigarettes  Smokeless Tobacco Not on file  Tobacco Comments   Soon, but not yet    Current Outpatient Medications on File Prior to Visit  Medication Sig Dispense Refill   amLODipine (NORVASC) 10 MG tablet Take 1 tablet (10 mg total) by mouth daily. 30 tablet 11   ibuprofen (ADVIL,MOTRIN) 200 MG tablet Take 600-800 mg by mouth every 6 (six) hours as needed for mild pain. For pain     omeprazole (PRILOSEC OTC) 20 MG tablet Take 1 tablet (20 mg total) by mouth daily. 28 tablet 1   ondansetron (ZOFRAN-ODT) 8 MG disintegrating tablet Take 1 tablet (8 mg total) by mouth every 8 (eight) hours as needed for nausea or vomiting. 20 tablet 0   polyethylene glycol (MIRALAX / GLYCOLAX) 17 g packet Take 17 g by mouth daily as  needed for moderate constipation. 14 each 0   sucralfate (CARAFATE) 1 GM/10ML suspension Take 10 mLs (1 g total) by mouth 4 (four) times daily -  with meals and at bedtime. 420 mL 0   [DISCONTINUED] metoCLOPramide (REGLAN) 10 MG tablet Take 1 tablet (10 mg total) by mouth every 6 (six) hours. (Patient not taking: Reported on 05/19/2020) 15 tablet 0   No current facility-administered medications on file prior to visit.    ROS see history of present illness  Objective  Physical Exam Vitals:   08/01/22 0811  BP: 126/84  Pulse: 85  Temp: 98.3 F (36.8 C)  SpO2: 98%    BP Readings from Last 3 Encounters:  08/01/22 126/84  07/19/22 120/68  07/14/22 (!) 142/68   Wt Readings from Last 3 Encounters:  08/01/22 226 lb 12.8 oz (102.9 kg)  07/19/22 230 lb 3.2 oz (104.4 kg)  07/14/22 228 lb 6.4 oz (103.6 kg)    Physical Exam Constitutional:      General: She is not in acute distress.    Appearance: Normal appearance.  HENT:     Head: Normocephalic.     Right Ear: Tympanic membrane normal.     Left Ear: Tympanic membrane normal.     Nose: Nose normal.     Mouth/Throat:     Mouth: Mucous membranes are moist.     Pharynx: Oropharynx is clear.  Eyes:     Conjunctiva/sclera: Conjunctivae normal.     Pupils: Pupils are equal, round,  and reactive to light.  Neck:     Thyroid: No thyromegaly.  Cardiovascular:     Rate and Rhythm: Normal rate and regular rhythm.     Heart sounds: Normal heart sounds.  Pulmonary:     Effort: Pulmonary effort is normal.     Breath sounds: Normal breath sounds.  Abdominal:     General: Abdomen is flat. Bowel sounds are normal.     Palpations: Abdomen is soft. There is no mass.     Tenderness: There is no abdominal tenderness.  Musculoskeletal:        General: Normal range of motion.  Lymphadenopathy:     Cervical: No cervical adenopathy.  Skin:    General: Skin is warm and dry.     Findings: No rash.  Neurological:     General: No focal deficit  present.     Mental Status: She is alert.  Psychiatric:        Mood and Affect: Mood normal.        Behavior: Behavior normal.      Assessment/Plan: Please see individual problem list.  Preventative health care -     VITAMIN D 25 Hydroxy (Vit-D Deficiency, Fractures)  Obesity (BMI 30-39.9) -     Hemoglobin A1c  Thyroid disorder screen -     TSH  Lipid screening -     Lipid panel  Screen for colon cancer  Tobacco abuse  Cervical lymphadenopathy  Primary hypertension -     CBC with Differential/Platelet -     Comprehensive metabolic panel    No follow-ups on file.   Bethanie Dicker, NP-C [facility] - [employer]

## 2022-08-02 NOTE — Assessment & Plan Note (Signed)
Resolved today. She will contact if symptoms return.

## 2022-08-02 NOTE — Assessment & Plan Note (Signed)
Currently smoking 1/2 to 1 pack per day. She is not interested in quitting at this time. Counseling on tobacco cessation provided.

## 2022-08-02 NOTE — Assessment & Plan Note (Signed)
Chronic. Stable on Norvasc 10 mg daily. Continue. Lab work as outlined.

## 2022-08-03 ENCOUNTER — Other Ambulatory Visit: Payer: Self-pay | Admitting: Nurse Practitioner

## 2022-08-03 ENCOUNTER — Telehealth: Payer: Self-pay

## 2022-08-03 DIAGNOSIS — N281 Cyst of kidney, acquired: Secondary | ICD-10-CM

## 2022-08-03 DIAGNOSIS — N2889 Other specified disorders of kidney and ureter: Secondary | ICD-10-CM

## 2022-08-03 MED ORDER — ZEPBOUND 7.5 MG/0.5ML ~~LOC~~ SOAJ
7.5000 mg | SUBCUTANEOUS | 0 refills | Status: DC
Start: 2022-08-03 — End: 2022-08-18

## 2022-08-03 MED ORDER — ZEPBOUND 2.5 MG/0.5ML ~~LOC~~ SOAJ
2.5000 mg | SUBCUTANEOUS | 0 refills | Status: DC
Start: 2022-08-03 — End: 2022-08-18

## 2022-08-03 MED ORDER — ZEPBOUND 5 MG/0.5ML ~~LOC~~ SOAJ
5.0000 mg | SUBCUTANEOUS | 0 refills | Status: DC
Start: 2022-08-03 — End: 2022-08-18

## 2022-08-03 NOTE — Telephone Encounter (Signed)
Patient states she has the following questions after reading the note in MyChart regarding visit from 08/01/2022:  Patient states she would like to know which injection medication Bethanie Dicker, NP, will be prescribing.  Patient states she needs to find out if her insurance will cover it.  Patient states she would like to know how she can find out this information. Patient states her LDL is high.  Patient states she would like to know if there is any information we can give her to help her get on the right track with her diet.  Patient states she would like to have a call back.

## 2022-08-03 NOTE — Telephone Encounter (Signed)
Pt needs a PA for zepbound   Provider: Bethanie Dicker, NP

## 2022-08-03 NOTE — Assessment & Plan Note (Signed)
Physical exam complete. Lab work as outlined. Will contact patient with results. Pap- UTD. Colonoscopy- due, referral to GI placed. Mammogram- due in August, she gets this at her Ob-Gyn office in [place]. She will contact them to schedule this. Flu vaccine not due. Tetanus vaccine- due/unsure of last one, declined today. Encouraged her to return for this or get at her local pharmacy when ready. Declined all COVID vaccines. HIV and Hep C screenings negative. Counseled on tobacco cessation. Recommended follow ups with Dentist and Ophthalmology for annual exams. Encouraged to continue working on healthy diet and begin exercising. Return to care in 3 months, sooner PRN.

## 2022-08-03 NOTE — Assessment & Plan Note (Signed)
Will check A1c today. She has been working on her diet, trying to increase her protein and eat healthier. She does not exercise. She is interested in injections to help with weight loss. Long discussion with patient regarding importance of healthy diet, exercise and lifestyle modifications even with medications. Will start the patient on Zepbound. Counseled on the risk of pancreatitis and gallbladder disease. Discussed the risk of nausea. Advised to discontinue the Zepbound and contact us immediately if they develop abdominal pain. If they develop excessive nausea they will contact us right away. I discussed that medullary thyroid cancer has been seen in rats studies. The patient confirmed no personal or family history of thyroid cancer, parathyroid cancer, or adrenal gland cancer. Discussed that we thus far have not seen medullary thyroid cancer result from use of this type of medication in humans. Advised to monitor the thyroid area and contact us for any lumps, swelling, trouble swallowing, or any other changes in this area.  Discussed goal weight loss of 1 to 2 pounds a week while on this medication. Will continue to monitor.

## 2022-08-05 ENCOUNTER — Encounter: Payer: Self-pay | Admitting: Nurse Practitioner

## 2022-08-05 ENCOUNTER — Telehealth: Payer: Self-pay

## 2022-08-05 ENCOUNTER — Other Ambulatory Visit (HOSPITAL_COMMUNITY): Payer: Self-pay

## 2022-08-05 NOTE — Telephone Encounter (Signed)
Pharmacy Patient Advocate Encounter   Received notification from Pt Calls Messages that prior authorization for Zepbound 2.5MG /0.5ML pen-injectors is required/requested.   Insurance verification completed.   The patient is insured through [facility] .   Per test claim: PA started via CoverMyMeds. KEY [id number] . Waiting for clinical questions to populate.

## 2022-08-05 NOTE — Telephone Encounter (Signed)
Pt informed via mychart

## 2022-08-05 NOTE — Telephone Encounter (Signed)
Pharmacy Patient Advocate Encounter  Received notification from [facility] that Prior Authorization for Zepbound 2.5MG /0.5ML pen-injectors has been DENIED because This health plan does not cover: any treatment or regimen, medical or surgical, for the purpose of reducing or controlling the weight of the member, of for the treatment of obesity, except for surgical treament of morbid obesity, or as specifically covered by this health benefit plan.Marland Kitchen  PA #/Case ID/Reference #: [id number]  Please be advised we currently do not have a Pharmacist to review denials, therefore you will need to process appeals accordingly as needed. Thanks for your support at this time. Contact for appeals are as follows: Phone: ---, Fax: [phone]  Denial letter attached to charts

## 2022-08-05 NOTE — Telephone Encounter (Signed)
PA request has been Submitted. New Encounter created for follow up. For additional info see Pharmacy Prior Auth telephone encounter from 08/05/22.

## 2022-08-05 NOTE — Telephone Encounter (Signed)
Clinical questions have been submitted.

## 2022-08-08 NOTE — Telephone Encounter (Signed)
Pt called in stating that she still waiting for other options meds because Zepbound was denied per her insurance. Please advice.

## 2022-08-08 NOTE — Telephone Encounter (Signed)
Pt called back to check status of previous message. Pt wants to know when the new apt for MRI?

## 2022-08-11 ENCOUNTER — Ambulatory Visit: Payer: BC Managed Care – PPO

## 2022-08-15 ENCOUNTER — Telehealth: Payer: Self-pay

## 2022-08-15 NOTE — Telephone Encounter (Signed)
PA needed for [facility]    Provider Bethanie Dicker, NP

## 2022-08-16 NOTE — Telephone Encounter (Signed)
Looks like PA was started for [employer] on 08/15/22. Will route to clinical pool to hold

## 2022-08-16 NOTE — Telephone Encounter (Signed)
Medication not covered by patients plan for the treatment of obesity. Denial letter in patients media for Zepbound in the same class.

## 2022-08-18 ENCOUNTER — Telehealth: Payer: Self-pay

## 2022-08-18 ENCOUNTER — Encounter: Payer: Self-pay | Admitting: Physician Assistant

## 2022-08-18 ENCOUNTER — Ambulatory Visit: Payer: BC Managed Care – PPO | Admitting: Physician Assistant

## 2022-08-18 ENCOUNTER — Other Ambulatory Visit: Payer: Self-pay

## 2022-08-18 VITALS — BP 122/79 | HR 98 | Temp 98.2°F | Ht 68.0 in | Wt 221.6 lb

## 2022-08-18 DIAGNOSIS — K582 Mixed irritable bowel syndrome: Secondary | ICD-10-CM | POA: Diagnosis not present

## 2022-08-18 DIAGNOSIS — Z1211 Encounter for screening for malignant neoplasm of colon: Secondary | ICD-10-CM

## 2022-08-18 DIAGNOSIS — R112 Nausea with vomiting, unspecified: Secondary | ICD-10-CM

## 2022-08-18 DIAGNOSIS — K5901 Slow transit constipation: Secondary | ICD-10-CM | POA: Diagnosis not present

## 2022-08-18 DIAGNOSIS — R1013 Epigastric pain: Secondary | ICD-10-CM

## 2022-08-18 DIAGNOSIS — R1084 Generalized abdominal pain: Secondary | ICD-10-CM

## 2022-08-18 MED ORDER — POLYETHYLENE GLYCOL 3350 17 GM/SCOOP PO POWD: ORAL | 0 refills | Status: DC

## 2022-08-18 MED ORDER — PEG 3350-KCL-NA BICARB-NACL 420 G PO SOLR: 4000.0000 mL | Freq: Once | ORAL | 0 refills | Status: AC

## 2022-08-18 MED ORDER — DICYCLOMINE HCL 10 MG PO CAPS
10.0000 mg | ORAL_CAPSULE | Freq: Three times a day (TID) | ORAL | 2 refills | Status: DC
Start: 2022-08-18 — End: 2023-07-20

## 2022-08-18 NOTE — Patient Instructions (Signed)
I recommend take OTC MiraLAX powder, mix 1 capful in a drink once daily for constipation. Take MiraLAX every day at least 1 week before colonoscopy to help with prep. Try Rx dicyclomine 10 Mg 1 tablet 2-3 times daily as needed for abdominal cramping.

## 2022-08-18 NOTE — Progress Notes (Signed)
Celso Amy, PA-C [address]  [address]  [place], [place] [address]  Main: [phone]  Fax: [phone]   Gastroenterology Consultation  Referring Provider:     Bethanie Dicker, NP Primary Care Physician:  Bethanie Dicker, NP Primary Gastroenterologist:  Celso Amy, PA-C / Dr. Lannette Donath   Reason for Consultation:     Nausea, vomiting, diarrhea, abdominal pain        HPI:   [name] is a 47 y.o. y/o female referred for consultation & management  by Bethanie Dicker, NP.    She went to [facility] long ED 05/16/2022 with acute abdominal pain, nausea, vomiting, diarrhea for 48 hours.  Abdominal pelvic CT with contrast showed no acute abnormality.  There was mild dilation of intra and extrahepatic bile ducts, thought due to prior cholecystectomy.  Left kidney cyst.  3 mm right lung nodule.  She has followed up with her PCP and is scheduled for abdominal MRI with and without contrast 08/25/2022.  She has had had 3 previous C-sections, abdominal hysterectomy, and cholecystectomy.  Most recent labs done 08/01/2022 showed normal hemoglobin 14.5, mildly elevated WBC 11.5, normal CMP, normal TSH, low vitamin D.  Previous normal lipase.  Has history of GERD and takes Prilosec 20 Mg daily.  Current smoker.  Rare alcohol use.  Has food allergies to bananas, cucumbers, and eggs which cause nausea and vomiting.  Previous EGD with EUS 05/2008 by Dr. Christella Hartigan at [facility] showed normal esophagus, stomach, and duodenum.  Common bile duct and pancreatic duct.  Gallbladder surgically absent.  Normal pancreas.  No etiology for abdominal pain found.  No previous colonoscopy.  Current symptoms: She has intermittent left-sided abdominal pain for 2 years.  Intermittent cramping in the lower abdomen which occurs 2 or 3 days/week.  She also has intermittent sharp epigastric pain when she eats certain foods such as milk, cucumbers, or acidic foods.  She has associated abdominal bloating and gas.  Has had  bowel irregularities with diarrhea alternating with constipation.  Primarily constipation.  She can go 7 days with no bowel movement.  She denies rectal bleeding or unintentional weight loss.  Struggles with weight gain.  She tried [place] last year which was not covered by insurance.    Past Medical History:  Diagnosis Date   Allergy Couple years ago   Diabetes mellitus without complication (HCC) During pregnancies   GERD (gastroesophageal reflux disease) 1 year   Hypertension About a year   Tobacco use disorder     Past Surgical History:  Procedure Laterality Date   ABDOMINAL HYSTERECTOMY     CESAREAN SECTION     CHOLECYSTECTOMY     MANDIBLE FRACTURE SURGERY      Prior to Admission medications   Medication Sig Start Date End Date Taking? Authorizing Provider  amLODipine (NORVASC) 10 MG tablet Take 1 tablet (10 mg total) by mouth daily. 11/30/21 11/30/22  Masters, Katie, DO  ibuprofen (ADVIL,MOTRIN) 200 MG tablet Take 600-800 mg by mouth every 6 (six) hours as needed for mild pain. For pain    [provider]  omeprazole (PRILOSEC OTC) 20 MG tablet Take 1 tablet (20 mg total) by mouth daily. 12/02/21 12/02/22  Masters, Katie, DO  tirzepatide (ZEPBOUND) 2.5 MG/0.5ML Pen Inject 2.5 mg into the skin once a week. X 4 weeks then increase. 08/03/22   Bethanie Dicker, NP  tirzepatide [facility]) 5 MG/0.5ML Pen Inject 5 mg into the skin once a week. X 4 weeks then increase 08/03/22  Bethanie Dicker, NP  tirzepatide (ZEPBOUND) 7.5 MG/0.5ML Pen Inject 7.5 mg into the skin once a week. X 4 weeks 08/03/22   Bethanie Dicker, NP  metoCLOPramide (REGLAN) 10 MG tablet Take 1 tablet (10 mg total) by mouth every 6 (six) hours. Patient not taking: Reported on 05/19/2020 03/21/11 05/19/20  Elpidio Anis, PA-C    Family History  Problem Relation Age of Onset   Hypertension Mother    Cancer Mother    Hypertension Maternal Grandmother    Cancer Maternal Grandmother    Cancer Maternal Grandfather     Hypertension Paternal Grandmother    Diabetes Paternal Grandmother      Social History   Tobacco Use   Smoking status: Every Day    Current packs/day: 0.50    Average packs/day: 0.5 packs/day for 15.0 years (7.5 ttl pk-yrs)    Types: Cigarettes   Tobacco comments:    Soon, but not yet  Substance Use Topics   Alcohol use: Yes    Comment: Every once in a blue moon   Drug use: No    Allergies as of 08/18/2022 - Review Complete 08/18/2022  Allergen Reaction Noted   Banana Nausea And Vomiting 11/18/2021   Cucumber extract Nausea And Vomiting 11/18/2021   Egg-derived products Nausea And Vomiting 11/18/2021    Review of Systems:    All systems reviewed and negative except where noted in HPI.   Physical Exam:  BP 122/79   Pulse 98   Temp 98.2 F (36.8 C)   Ht 5\' 8"  (1.727 m)   Wt 221 lb 9.6 oz (100.5 kg)   BMI 33.69 kg/m  No LMP recorded. Patient has had a hysterectomy. Psych:  Alert and cooperative. Normal mood and affect. General:   Alert,  Well-developed, well-nourished, pleasant and cooperative in NAD Head:  Normocephalic and atraumatic. Eyes:  Sclera clear, no icterus.   Conjunctiva pink. Lungs:  Respirations even and unlabored.  Clear throughout to auscultation.   No wheezes, crackles, or rhonchi. No acute distress. Heart:  Regular rate and rhythm; no murmurs, clicks, rubs, or gallops. Abdomen:  Normal bowel sounds.  No bruits.  Soft, and non-distended without masses, hepatosplenomegaly or hernias noted.  Mild epigastric, and left side abdominal tenderness.  No guarding or rebound tenderness.    Neurologic:  Alert and oriented x3;  grossly normal neurologically. Psych:  Alert and cooperative. Normal mood and affect.  Imaging Studies: No results found.  Assessment and Plan:   [name] is a 47 y.o. y/o female has been referred for chronic intermittent nausea, vomiting, diarrhea, and abdominal pain.  Recent abdominal pelvic CT showed no acute abnormality.   She is also scheduled for abdominal MRI 08/18/2022.  She has had 3 C-sections, hysterectomy, and cholecystectomy.  Previous EGD/EUS in 2010 was normal.   She is age 22 and is due for first screening colonoscopy.  Symptoms are most consistent with irritable bowel syndrome and acid reflux.  GERD, gastritis, and H. pylori are also in the differential.  Scheduling EGD and colonoscopy.  Giving trial of treatment with follow-up.  1.  Nausea/vomiting  Scheduling EGD I discussed risks of EGD with patient to include risk of bleeding, perforation, and risk of sedation.  Patient expressed understanding and agrees to proceed with EGD.   If symptoms persist and EGD/colonoscopy are unrevealing, then consider labs to check for food allergies and alpha gal.  2.  Abdominal pain: Epigastric and Generalized Cramping  Try prescription dicyclomine 10 Mg 1 3 times  daily as needed abdominal cramping. Try low FODMAP diet for possible IBS Continue omeprazole 20 Mg daily for GERD.  3.  Irregular bowel habits, predominant constipation with occasional diarrhea; suspect IBS  Recommend OTC MiraLAX 1 capful once daily for constipation.  OTC fiber supplement.  4.  Colon cancer screening  Scheduling Colonoscopy I discussed risks of colonoscopy with patient to include risk of bleeding, colon perforation, and risk of sedation.  Patient expressed understanding and agrees to proceed with colonoscopy.   Follow up 4 weeks after EGD and colonoscopy with TG.  Celso Amy, PA-C

## 2022-08-18 NOTE — Telephone Encounter (Signed)
Spoke with patient to let her know she will need to do the 2 day prep-new prep instructions discussed over the phone as well as mailed . Miralax and Golytely was sent to pharmacy.

## 2022-08-25 ENCOUNTER — Ambulatory Visit
Admission: RE | Admit: 2022-08-25 | Discharge: 2022-08-25 | Disposition: A | Discharge status: Home / Self Care | Payer: BC Managed Care – PPO | Source: Ambulatory Visit | Attending: Nurse Practitioner | Admitting: Nurse Practitioner

## 2022-08-25 DIAGNOSIS — N2889 Other specified disorders of kidney and ureter: Secondary | ICD-10-CM

## 2022-08-25 DIAGNOSIS — N281 Cyst of kidney, acquired: Secondary | ICD-10-CM

## 2022-08-25 MED ORDER — GADOPICLENOL 0.5 MMOL/ML IV SOLN
10.0000 mL | Freq: Once | INTRAVENOUS | Status: AC | PRN
  Administered 2022-08-25: 10 mL via INTRAVENOUS

## 2022-08-29 ENCOUNTER — Encounter: Payer: Self-pay | Admitting: Physician Assistant

## 2022-08-29 ENCOUNTER — Telehealth: Payer: Self-pay

## 2022-08-29 ENCOUNTER — Encounter: Payer: Self-pay | Admitting: Nurse Practitioner

## 2022-08-29 DIAGNOSIS — R1084 Generalized abdominal pain: Secondary | ICD-10-CM

## 2022-08-29 DIAGNOSIS — R112 Nausea with vomiting, unspecified: Secondary | ICD-10-CM

## 2022-08-29 MED ORDER — ONDANSETRON 4 MG PO TBDP: 4.0000 mg | ORAL_TABLET | Freq: Three times a day (TID) | ORAL | 1 refills | Status: DC | PRN

## 2022-08-29 NOTE — Telephone Encounter (Signed)
.    Patient declines xray at this time-she had MRI last week and would like for you to look at that-she has not heard from PCP yet- I did call in Zofran  and she will try increasing Omeprazole.she would like a call back after you look at the MRI.  Order abdominal x-ray, 1 view, evaluate stool burden and rule out bowel obstruction.  Diagnosis abdominal pain. 2.  Increase omeprazole to 20 mg twice daily. 3.  We can prescribe Zofran 4 mg 1 tablet every 6 hours as needed nausea, #30, 1 refill. Celso Amy, PA-C

## 2022-08-29 NOTE — Telephone Encounter (Signed)
Abdominal pain started Friday on and off but mostly after meals. She continues to have it today. Her procedures are scheduled 10-03-22-she is wanting to know if there is something she can take. Dicyclomine is not helping. She is taking Zofran for the nausea. Is there something else she can try. She is taking all medications you have prescribed.

## 2022-09-07 ENCOUNTER — Ambulatory Visit: Payer: BC Managed Care – PPO | Admitting: Physician Assistant

## 2022-09-09 ENCOUNTER — Other Ambulatory Visit: Payer: Self-pay | Admitting: Nurse Practitioner

## 2022-09-09 DIAGNOSIS — N281 Cyst of kidney, acquired: Secondary | ICD-10-CM

## 2022-09-26 DIAGNOSIS — N2889 Other specified disorders of kidney and ureter: Secondary | ICD-10-CM | POA: Insufficient documentation

## 2022-09-28 ENCOUNTER — Other Ambulatory Visit: Payer: Self-pay | Admitting: Nurse Practitioner

## 2022-09-28 ENCOUNTER — Encounter: Payer: Self-pay | Admitting: Nurse Practitioner

## 2022-09-28 DIAGNOSIS — D72829 Elevated white blood cell count, unspecified: Secondary | ICD-10-CM

## 2022-09-29 ENCOUNTER — Other Ambulatory Visit (INDEPENDENT_AMBULATORY_CARE_PROVIDER_SITE_OTHER): Payer: BC Managed Care – PPO

## 2022-09-29 DIAGNOSIS — D72829 Elevated white blood cell count, unspecified: Secondary | ICD-10-CM

## 2022-09-29 LAB — CBC WITH DIFFERENTIAL/PLATELET
Basophils Absolute: 0.1 10*3/uL (ref 0.0–0.1)
Basophils Relative: 0.7 % (ref 0.0–3.0)
Eosinophils Absolute: 0.4 10*3/uL (ref 0.0–0.7)
Eosinophils Relative: 4 % (ref 0.0–5.0)
HCT: 41.9 % (ref 36.0–46.0)
Hemoglobin: 13.9 g/dL (ref 12.0–15.0)
Lymphocytes Relative: 33.4 % (ref 12.0–46.0)
Lymphs Abs: 3 10*3/uL (ref 0.7–4.0)
MCHC: 33.2 g/dL (ref 30.0–36.0)
MCV: 91.2 fl (ref 78.0–100.0)
Monocytes Absolute: 0.6 10*3/uL (ref 0.1–1.0)
Monocytes Relative: 6.5 % (ref 3.0–12.0)
Neutro Abs: 4.9 10*3/uL (ref 1.4–7.7)
Neutrophils Relative %: 55.4 % (ref 43.0–77.0)
Platelets: 329 10*3/uL (ref 150.0–400.0)
RBC: 4.59 Mil/uL (ref 3.87–5.11)
RDW: 12.6 % (ref 11.5–15.5)
WBC: 8.9 10*3/uL (ref 4.0–10.5)

## 2022-10-03 ENCOUNTER — Ambulatory Visit: Payer: BC Managed Care – PPO | Admitting: Anesthesiology

## 2022-10-03 ENCOUNTER — Other Ambulatory Visit: Payer: Self-pay | Admitting: Gastroenterology

## 2022-10-03 ENCOUNTER — Encounter
Admission: RE | Disposition: A | Discharge status: Home / Self Care | Payer: Self-pay | Source: Home / Self Care | Attending: Gastroenterology

## 2022-10-03 ENCOUNTER — Telehealth: Payer: Self-pay

## 2022-10-03 ENCOUNTER — Encounter: Payer: Self-pay | Admitting: Gastroenterology

## 2022-10-03 ENCOUNTER — Ambulatory Visit
Admission: RE | Admit: 2022-10-03 | Discharge: 2022-10-03 | Disposition: A | Discharge status: Home / Self Care | Payer: BC Managed Care – PPO | Attending: Gastroenterology | Admitting: Gastroenterology

## 2022-10-03 ENCOUNTER — Other Ambulatory Visit: Payer: Self-pay

## 2022-10-03 DIAGNOSIS — R1013 Epigastric pain: Secondary | ICD-10-CM

## 2022-10-03 DIAGNOSIS — F17210 Nicotine dependence, cigarettes, uncomplicated: Secondary | ICD-10-CM | POA: Insufficient documentation

## 2022-10-03 DIAGNOSIS — K3189 Other diseases of stomach and duodenum: Secondary | ICD-10-CM | POA: Diagnosis not present

## 2022-10-03 DIAGNOSIS — Z1211 Encounter for screening for malignant neoplasm of colon: Secondary | ICD-10-CM | POA: Diagnosis present

## 2022-10-03 DIAGNOSIS — K269 Duodenal ulcer, unspecified as acute or chronic, without hemorrhage or perforation: Secondary | ICD-10-CM

## 2022-10-03 DIAGNOSIS — I10 Essential (primary) hypertension: Secondary | ICD-10-CM | POA: Diagnosis not present

## 2022-10-03 DIAGNOSIS — K219 Gastro-esophageal reflux disease without esophagitis: Secondary | ICD-10-CM | POA: Diagnosis not present

## 2022-10-03 HISTORY — PX: ESOPHAGOGASTRODUODENOSCOPY (EGD) WITH PROPOFOL: SHX5813

## 2022-10-03 HISTORY — DX: Prediabetes: R73.03

## 2022-10-03 HISTORY — PX: COLONOSCOPY WITH PROPOFOL: SHX5780

## 2022-10-03 HISTORY — PX: BIOPSY: SHX5522

## 2022-10-03 SURGERY — ESOPHAGOGASTRODUODENOSCOPY (EGD) WITH PROPOFOL
Anesthesia: General

## 2022-10-03 MED ORDER — PROPOFOL 10 MG/ML IV BOLUS
INTRAVENOUS | Status: DC | PRN
  Administered 2022-10-03: 100 mg via INTRAVENOUS

## 2022-10-03 MED ORDER — PROPOFOL 500 MG/50ML IV EMUL
INTRAVENOUS | Status: DC | PRN
  Administered 2022-10-03: 120 ug/kg/min via INTRAVENOUS

## 2022-10-03 MED ORDER — MIDAZOLAM HCL 2 MG/2ML IJ SOLN
INTRAMUSCULAR | Status: AC
  Filled 2022-10-03: qty 2

## 2022-10-03 MED ORDER — MIDAZOLAM HCL 5 MG/5ML IJ SOLN
INTRAMUSCULAR | Status: DC | PRN
  Administered 2022-10-03: 2 mg via INTRAVENOUS

## 2022-10-03 MED ORDER — SODIUM CHLORIDE 0.9 % IV SOLN
INTRAVENOUS | Status: DC
  Administered 2022-10-03: 1000 mL via INTRAVENOUS

## 2022-10-03 MED ORDER — ONDANSETRON HCL 4 MG/2ML IJ SOLN
INTRAMUSCULAR | Status: DC | PRN
  Administered 2022-10-03: 4 mg via INTRAVENOUS

## 2022-10-03 MED ORDER — GOLYTELY 236 G PO SOLR: 4000.0000 mL | Freq: Once | ORAL | 0 refills | Status: AC

## 2022-10-03 MED ORDER — GLYCOPYRROLATE 0.2 MG/ML IJ SOLN
INTRAMUSCULAR | Status: DC | PRN
  Administered 2022-10-03: .2 mg via INTRAVENOUS

## 2022-10-03 MED ORDER — LIDOCAINE 2% (20 MG/ML) 5 ML SYRINGE
INTRAMUSCULAR | Status: DC | PRN
  Administered 2022-10-03: 20 mg via INTRAVENOUS

## 2022-10-03 MED ORDER — POLYETHYLENE GLYCOL 3350 17 GM/SCOOP PO POWD: ORAL | 0 refills | Status: DC

## 2022-10-03 MED ORDER — ONDANSETRON HCL 4 MG/2ML IJ SOLN
INTRAMUSCULAR | Status: AC
  Filled 2022-10-03: qty 2

## 2022-10-03 NOTE — Op Note (Signed)
[facility] Gastroenterology Patient Name: [name] Procedure Date: 10/03/2022 9:36 AM MRN: [id number] Account #: [id number] Date of Birth: 27-Apr-1975 Admit Type: Outpatient Age: 47 Room: [facility] ENDO ROOM 4 Gender: Female Note Status: Finalized Instrument Name: Upper Endoscope [id number] Procedure:             Upper GI endoscopy Indications:           Dyspepsia Providers:             Toney Reil MD, MD Referring MD:          Bethanie Dicker, NP Medicines:             General Anesthesia Complications:         No immediate complications. Estimated blood loss: None. Procedure:             Pre-Anesthesia Assessment:                        - Prior to the procedure, a History and Physical was                         performed, and patient medications and allergies were                         reviewed. The patient is competent. The risks and                         benefits of the procedure and the sedation options and                         risks were discussed with the patient. All questions                         were answered and informed consent was obtained.                         Patient identification and proposed procedure were                         verified by the physician, the nurse, the                         anesthesiologist, the anesthetist and the technician                         in the pre-procedure area in the procedure room in the                         endoscopy suite. Mental Status Examination: alert and                         oriented. Airway Examination: normal oropharyngeal                         airway and neck mobility. Respiratory Examination:                         clear to auscultation. CV Examination: normal.  Prophylactic Antibiotics: The patient does not require                         prophylactic antibiotics. Prior Anticoagulants: The                         patient has taken no anticoagulant or  antiplatelet                         agents. ASA Grade Assessment: II - A patient with mild                         systemic disease. After reviewing the risks and                         benefits, the patient was deemed in satisfactory                         condition to undergo the procedure. The anesthesia                         plan was to use general anesthesia. Immediately prior                         to administration of medications, the patient was                         re-assessed for adequacy to receive sedatives. The                         heart rate, respiratory rate, oxygen saturations,                         blood pressure, adequacy of pulmonary ventilation, and                         response to care were monitored throughout the                         procedure. The physical status of the patient was                         re-assessed after the procedure.                        After obtaining informed consent, the endoscope was                         passed under direct vision. Throughout the procedure,                         the patient's blood pressure, pulse, and oxygen                         saturations were monitored continuously. The Endoscope                         was introduced through the mouth, and advanced to the  second part of duodenum. The upper GI endoscopy was                         accomplished without difficulty. The patient tolerated                         the procedure well. Findings:      Multiple diffuse erosions without bleeding were found in the duodenal       bulb. Biopsies were taken with a cold forceps for histology.      The second portion of the duodenum was normal. Biopsies were taken with       a cold forceps for histology.      Multiple dispersed diminutive erosions with no bleeding and no stigmata       of recent bleeding were found in the gastric antrum and in the       prepyloric region of the  stomach. Biopsies were taken with a cold       forceps for Helicobacter pylori testing.      The cardia and gastric fundus were normal on retroflexion.      The gastric body was normal. Biopsies were taken with a cold forceps for       Helicobacter pylori testing.      Esophagogastric landmarks were identified: the gastroesophageal junction       was found at 39 cm from the incisors.      The gastroesophageal junction and examined esophagus were normal. Impression:            - Duodenal erosions without bleeding. Biopsied.                        - Normal second portion of the duodenum. Biopsied.                        - Erosive gastropathy with no bleeding and no stigmata                         of recent bleeding. Biopsied.                        - Normal gastric body. Biopsied.                        - Esophagogastric landmarks identified.                        - Normal gastroesophageal junction and esophagus. Recommendation:        - Await pathology results.                        - Proceed with colonoscopy as scheduled                        See colonoscopy report Procedure Code(s):     --- Professional ---                        [phone], Esophagogastroduodenoscopy, flexible,                         transoral; with biopsy, single or multiple Diagnosis Code(s):     ---  Professional ---                        K26.9, Duodenal ulcer, unspecified as acute or                         chronic, without hemorrhage or perforation                        K31.89, Other diseases of stomach and duodenum                        R10.13, Epigastric pain CPT copyright 2022 American Medical Association. All rights reserved. The codes documented in this report are preliminary and upon coder review may  be revised to meet current compliance requirements. Dr. Libby Maw Toney Reil MD, MD 10/03/2022 9:59:29 AM This report has been signed electronically. Number of Addenda: 0 Note Initiated On:  10/03/2022 9:36 AM Estimated Blood Loss:  Estimated blood loss: none.      [facility]

## 2022-10-03 NOTE — H&P (Signed)
Angel Repress, MD [address]  [address]  [place], [place] [address]  Main: [phone]  Fax: [phone] Pager: [phone]  Primary Care Physician:  Bethanie Dicker, NP Primary Gastroenterologist:  Dr. Arlyss Watson  Pre-Procedure History & Physical: HPI:  [name] is a 47 y.o. female is here for an endoscopy and colonoscopy.   Past Medical History:  Diagnosis Date   Allergy Couple years ago   GERD (gastroesophageal reflux disease) 1 year   Hypertension About a year   Pre-diabetes    Tobacco use disorder     Past Surgical History:  Procedure Laterality Date   ABDOMINAL HYSTERECTOMY     CESAREAN SECTION     CHOLECYSTECTOMY     MANDIBLE FRACTURE SURGERY      Prior to Admission medications   Medication Sig Start Date End Date Taking? Authorizing Provider  amLODipine (NORVASC) 10 MG tablet Take 1 tablet (10 mg total) by mouth daily. 11/30/21 11/30/22  Masters, Katie, DO  dicyclomine (BENTYL) 10 MG capsule Take 1 capsule (10 mg total) by mouth 3 (three) times daily before meals. 08/18/22 11/16/22  Celso Amy, PA-C  ibuprofen (ADVIL,MOTRIN) 200 MG tablet Take 600-800 mg by mouth every 6 (six) hours as needed for mild pain. For pain    [provider]  Multiple Vitamin (MULTIVITAMIN) capsule Take 1 capsule by mouth daily.    [provider]  ondansetron (ZOFRAN-ODT) 4 MG disintegrating tablet Take 1 tablet (4 mg total) by mouth every 8 (eight) hours as needed for nausea or vomiting. 08/29/22   Celso Amy, PA-C  polyethylene glycol powder (MIRALAX) 17 GM/SCOOP powder Mix full container in 64 ounces of Gatorade or other clear liquid. No Red Liquids 08/18/22   Celso Amy, PA-C  metoCLOPramide (REGLAN) 10 MG tablet Take 1 tablet (10 mg total) by mouth every 6 (six) hours. Patient not taking: Reported on 05/19/2020 03/21/11 05/19/20  Elpidio Anis, PA-C    Allergies as of 08/18/2022 - Review Complete 08/18/2022  Allergen Reaction Noted   Banana  Nausea And Vomiting 11/18/2021   Cucumber extract Nausea And Vomiting 11/18/2021   Egg-derived products Nausea And Vomiting 11/18/2021    Family History  Problem Relation Age of Onset   Hypertension Mother    Cancer Mother    Hypertension Maternal Grandmother    Cancer Maternal Grandmother    Cancer Maternal Grandfather    Hypertension Paternal Grandmother    Diabetes Paternal Grandmother     Social History   Socioeconomic History   Marital status: Married    Spouse name: Not on file   Number of children: Not on file   Years of education: Not on file   Highest education level: Not on file  Occupational History   Not on file  Tobacco Use   Smoking status: Every Day    Current packs/day: 0.50    Average packs/day: 0.5 packs/day for 15.0 years (7.5 ttl pk-yrs)    Types: Cigarettes   Smokeless tobacco: Never   Tobacco comments:    Soon, but not yet  Vaping Use   Vaping status: Never Used  Substance and Sexual Activity   Alcohol use: Yes    Comment: Every once in a blue moon   Drug use: No   Sexual activity: Yes  Other Topics Concern   Not on file  Social History Narrative   Not on file   Social Determinants of Health   Financial Resource Strain: Not on file  Food Insecurity: No  Food Insecurity (11/19/2021)   Hunger Vital Sign    Worried About Running Out of Food in the Last Year: Never true    Ran Out of Food in the Last Year: Never true  Transportation Needs: No Transportation Needs (11/19/2021)   PRAPARE - Administrator, Civil Service (Medical): No    Lack of Transportation (Non-Medical): No  Physical Activity: Not on file  Stress: Not on file  Social Connections: Unknown (05/28/2021)   Received from [facility]   Social Network    Social Network: Not on file  Intimate Partner Violence: Not At Risk (11/19/2021)   Humiliation, Afraid, Rape, and Kick questionnaire    Fear of Current or Ex-Partner: No    Emotionally Abused: No     Physically Abused: No    Sexually Abused: No    Review of Systems: See HPI, otherwise negative ROS  Physical Exam: BP 115/80   Pulse 69   Temp (!) 96.7 F (35.9 C) (Temporal)   Resp 18   Ht 5\' 8"  (1.727 m)   Wt 91.2 kg   SpO2 100%   BMI 30.56 kg/m  General:   Alert,  pleasant and cooperative in NAD Head:  Normocephalic and atraumatic. Neck:  Supple; no masses or thyromegaly. Lungs:  Clear throughout to auscultation.    Heart:  Regular rate and rhythm. Abdomen:  Soft, nontender and nondistended. Normal bowel sounds, without guarding, and without rebound.   Neurologic:  Alert and  oriented x4;  grossly normal neurologically.  Impression/Plan: [name] is here for an endoscopy and colonoscopy to be performed for dyspepsia, colon cancer screening  Risks, benefits, limitations, and alternatives regarding  endoscopy and colonoscopy have been reviewed with the patient.  Questions have been answered.  All parties agreeable.   Lannette Donath, MD  10/03/2022, 9:03 AM

## 2022-10-03 NOTE — Telephone Encounter (Signed)
Patient was not clean out for colonoscopy today. Called patient and got repeat schedule for 10/25/2022 with dr. Allegra Lai In [place]. She states she did the 2 day prep informed her to take Miralax 1 cupful daily for 7 days before procedure and to drink plenty of fluids. She said she did get nausea. Told her to take zofran a hour before prep and as needed. Mailed instructions to patient and sent to [facility]

## 2022-10-03 NOTE — Op Note (Signed)
[facility] Gastroenterology Patient Name: [name] Procedure Date: 10/03/2022 9:35 AM MRN: [id number] Account #: [id number] Date of Birth: 27-Jan-1975 Admit Type: Outpatient Age: 47 Room: [facility] ENDO ROOM 4 Gender: Female Note Status: Finalized Instrument Name: Prentice Docker [id number] Procedure:             Colonoscopy Indications:           Screening for colorectal malignant neoplasm, This is                         the patient's first colonoscopy Providers:             Toney Reil MD, MD Referring MD:          Bethanie Dicker, NP Medicines:             General Anesthesia Complications:         No immediate complications. Estimated blood loss: None. Procedure:             Pre-Anesthesia Assessment:                        - Prior to the procedure, a History and Physical was                         performed, and patient medications and allergies were                         reviewed. The patient is competent. The risks and                         benefits of the procedure and the sedation options and                         risks were discussed with the patient. All questions                         were answered and informed consent was obtained.                         Patient identification and proposed procedure were                         verified by the physician, the nurse, the                         anesthesiologist, the anesthetist and the technician                         in the pre-procedure area in the procedure room in the                         endoscopy suite. Mental Status Examination: alert and                         oriented. Airway Examination: normal oropharyngeal                         airway and neck mobility. Respiratory Examination:  clear to auscultation. CV Examination: normal.                         Prophylactic Antibiotics: The patient does not require                         prophylactic antibiotics.  Prior Anticoagulants: The                         patient has taken no anticoagulant or antiplatelet                         agents. ASA Grade Assessment: II - A patient with mild                         systemic disease. After reviewing the risks and                         benefits, the patient was deemed in satisfactory                         condition to undergo the procedure. The anesthesia                         plan was to use general anesthesia. Immediately prior                         to administration of medications, the patient was                         re-assessed for adequacy to receive sedatives. The                         heart rate, respiratory rate, oxygen saturations,                         blood pressure, adequacy of pulmonary ventilation, and                         response to care were monitored throughout the                         procedure. The physical status of the patient was                         re-assessed after the procedure.                        After obtaining informed consent, the colonoscope was                         passed under direct vision. Throughout the procedure,                         the patient's blood pressure, pulse, and oxygen                         saturations were monitored continuously. The  Colonoscope was introduced through the anus and                         advanced to the the cecum, identified by appendiceal                         orifice and ileocecal valve. The colonoscopy was                         unusually difficult due to inadequate bowel prep. The                         patient tolerated the procedure well. The quality of                         the bowel preparation was evaluated using the BBPS                         [facility] Bowel Preparation Scale) with scores of: Right                         Colon = 1 (portion of mucosa seen, but other areas not                         well seen  due to staining, residual stool and/or                         opaque liquid), Transverse Colon = 2 (minor amount of                         residual staining, small fragments of stool and/or                         opaque liquid, but mucosa seen well) and Left Colon =                         2 (minor amount of residual staining, small fragments                         of stool and/or opaque liquid, but mucosa seen well).                         The total BBPS score equals 5. The quality of the                         bowel preparation was inadequate. The ileocecal valve,                         appendiceal orifice, and rectum were photographed. Findings:      The perianal and digital rectal examinations were normal. Pertinent       negatives include normal sphincter tone and no palpable rectal lesions.      Extensive amounts of semi-liquid stool was found in the entire colon,       precluding visualization.      The retroflexed view of the distal rectum and anal verge was normal and  showed no anal or rectal abnormalities. Impression:            - Preparation of the colon was inadequate.                        - Stool in the entire examined colon.                        - The distal rectum and anal verge are normal on                         retroflexion view.                        - No specimens collected. Recommendation:        - Discharge patient to home (with escort).                        - Resume previous diet today.                        - Continue present medications.                        - Repeat colonoscopy at next available appointment                         (within 3 months) with 2 day prep because the bowel                         preparation was suboptimal. Procedure Code(s):     --- Professional ---                        M8413, Colorectal cancer screening; colonoscopy on                         individual not meeting criteria for high risk Diagnosis Code(s):      --- Professional ---                        Z12.11, Encounter for screening for malignant neoplasm                         of colon CPT copyright 2022 American Medical Association. All rights reserved. The codes documented in this report are preliminary and upon coder review may  be revised to meet current compliance requirements. Dr. Libby Maw Toney Reil MD, MD 10/03/2022 10:16:59 AM This report has been signed electronically. Number of Addenda: 0 Note Initiated On: 10/03/2022 9:35 AM Scope Withdrawal Time: 0 hours 6 minutes 0 seconds  Total Procedure Duration: 0 hours 11 minutes 16 seconds  Estimated Blood Loss:  Estimated blood loss: none.      [facility]

## 2022-10-03 NOTE — Anesthesia Postprocedure Evaluation (Signed)
Anesthesia Post Note  Patient: [name]  Procedure(s) Performed: ESOPHAGOGASTRODUODENOSCOPY (EGD) WITH PROPOFOL COLONOSCOPY WITH PROPOFOL BIOPSY  Patient location during evaluation: Endoscopy Anesthesia Type: General Level of consciousness: awake and alert Pain management: pain level controlled Vital Signs Assessment: post-procedure vital signs reviewed and stable Respiratory status: spontaneous breathing, nonlabored ventilation, respiratory function stable and patient connected to nasal cannula oxygen Cardiovascular status: blood pressure returned to baseline and stable Postop Assessment: no apparent nausea or vomiting Anesthetic complications: no   No notable events documented.   Last Vitals:  Vitals:   10/03/22 1027 10/03/22 1034  BP: 122/73 (!) 126/90  Pulse: 66 74  Resp: 19 19  Temp: (!) 35.7 C (!) 35.7 C  SpO2: 98%     Last Pain:  Vitals:   10/03/22 1034  TempSrc: Temporal  PainSc: 0-No pain                 Corinda Gubler

## 2022-10-03 NOTE — Anesthesia Preprocedure Evaluation (Signed)
Anesthesia Evaluation  Patient identified by MRN, date of birth, ID band Patient awake    Reviewed: Allergy & Precautions, NPO status , Patient's Chart, lab work & pertinent test results  History of Anesthesia Complications Negative for: history of anesthetic complications  Airway Mallampati: II  TM Distance: >3 FB Neck ROM: Full    Dental no notable dental hx. (+) Teeth Intact   Pulmonary neg sleep apnea, neg COPD, Current SmokerPatient did not abstain from smoking.   Pulmonary exam normal breath sounds clear to auscultation       Cardiovascular Exercise Tolerance: Good METShypertension, Pt. on medications (-) CAD and (-) Past MI (-) dysrhythmias  Rhythm:Regular Rate:Normal - Systolic murmurs    Neuro/Psych negative neurological ROS  negative psych ROS   GI/Hepatic ,GERD  ,,(+)     (-) substance abuse  Patient nauseated today from all the GI prep she's taken. Denies vomiting. Usually takes zofran at home.   Endo/Other  neg diabetes    Renal/GU negative Renal ROS     Musculoskeletal   Abdominal   Peds  Hematology   Anesthesia Other Findings Past Medical History: Couple years ago: Allergy 1 year: GERD (gastroesophageal reflux disease) About a year: Hypertension No date: Pre-diabetes No date: Tobacco use disorder  Reproductive/Obstetrics                             Anesthesia Physical Anesthesia Plan  ASA: 2  Anesthesia Plan: General   Post-op Pain Management: Minimal or no pain anticipated   Induction: Intravenous  PONV Risk Score and Plan: 2 and Propofol infusion, TIVA and Ondansetron  Airway Management Planned: Nasal Cannula  Additional Equipment: None  Intra-op Plan:   Post-operative Plan:   Informed Consent: I have reviewed the patients History and Physical, chart, labs and discussed the procedure including the risks, benefits and alternatives for the proposed  anesthesia with the patient or authorized representative who has indicated his/her understanding and acceptance.     Dental advisory given  Plan Discussed with: CRNA and Surgeon  Anesthesia Plan Comments: (Will give zofran preop. Discussed risks of anesthesia with patient, including possibility of difficulty with spontaneous ventilation under anesthesia necessitating airway intervention, PONV, and rare risks such as cardiac or respiratory or neurological events, and allergic reactions. Discussed the role of CRNA in patient's perioperative care. Patient understands.)       Anesthesia Quick Evaluation

## 2022-10-03 NOTE — Transfer of Care (Signed)
Immediate Anesthesia Transfer of Care Note  Patient: [name]  Procedure(s) Performed: ESOPHAGOGASTRODUODENOSCOPY (EGD) WITH PROPOFOL COLONOSCOPY WITH PROPOFOL BIOPSY  Patient Location: Endoscopy Unit  Anesthesia Type:General  Level of Consciousness: awake  Airway & Oxygen Therapy: Patient Spontanous Breathing  Post-op Assessment: Report given to RN and Post -op Vital signs reviewed and stable  Post vital signs: Reviewed  Last Vitals:  Vitals Value Taken Time  BP 116/77   Temp 35.7 C 10/03/22 1017  Pulse 74 10/03/22 1017  Resp 14 10/03/22 1017  SpO2 100     Last Pain:  Vitals:   10/03/22 1017  TempSrc: Temporal  PainSc: 0-No pain         Complications: No notable events documented.

## 2022-10-04 ENCOUNTER — Encounter: Payer: Self-pay | Admitting: Gastroenterology

## 2022-10-04 LAB — SURGICAL PATHOLOGY

## 2022-10-05 ENCOUNTER — Telehealth: Payer: Self-pay

## 2022-10-05 MED ORDER — OMEPRAZOLE 40 MG PO CPDR: 40.0000 mg | DELAYED_RELEASE_CAPSULE | Freq: Every day | ORAL | 0 refills | Status: AC

## 2022-10-05 NOTE — Telephone Encounter (Signed)
Patient verbalized understanding of results. Sent medication to the pharmacy

## 2022-10-05 NOTE — Telephone Encounter (Signed)
-----   Message from [facility] sent at 10/05/2022 10:23 AM EDT ----- Pathology results show mild inflammation only. Recommend 1 month trial of Protonix or Prilosec 40 mg daily before breakfast or dinner  RV

## 2022-10-11 NOTE — Telephone Encounter (Signed)
Called pharmacy because patient states she could not pick up the medication that was called in for her.pharmacy stated that insurance will not cover the omeprazole 40mg  they want patient to be on the 20mg  which is OTC please advise

## 2022-10-11 NOTE — Telephone Encounter (Signed)
Called and patient verbalized understanding she will get the omeprazole 20mg  and try for 1 month

## 2022-10-17 ENCOUNTER — Encounter: Payer: Self-pay | Admitting: Gastroenterology

## 2022-10-17 ENCOUNTER — Encounter: Payer: Self-pay | Admitting: Anesthesiology

## 2022-10-17 NOTE — Anesthesia Preprocedure Evaluation (Signed)
Anesthesia Evaluation    Airway Mallampati: II  TM Distance: >3 FB     Dental no notable dental hx.    Pulmonary Current Smoker          Cardiovascular hypertension,      Neuro/Psych    GI/Hepatic   Endo/Other    Renal/GU      Musculoskeletal   Abdominal   Peds  Hematology   Anesthesia Other Findings Tobacco use disorder  Allergy GERD (gastroesophageal reflux disease) Hypertension Pre-diabetes   Hx EGD 10-03-22 with Dr. Suzan Slick anesthesiologist   Reproductive/Obstetrics                              Anesthesia Physical Anesthesia Plan  ASA: 2  Anesthesia Plan: General   Post-op Pain Management:    Induction: Intravenous  PONV Risk Score and Plan:   Airway Management Planned: Natural Airway and Nasal Cannula  Additional Equipment:   Intra-op Plan:   Post-operative Plan:   Informed Consent: I have reviewed the patients History and Physical, chart, labs and discussed the procedure including the risks, benefits and alternatives for the proposed anesthesia with the patient or authorized representative who has indicated his/her understanding and acceptance.     Dental Advisory Given  Plan Discussed with: Anesthesiologist, CRNA and Surgeon  Anesthesia Plan Comments: (Patient consented for risks of anesthesia including but not limited to:  - adverse reactions to medications - risk of airway placement if required - damage to eyes, teeth, lips or other oral mucosa - nerve damage due to positioning  - sore throat or hoarseness - Damage to heart, brain, nerves, lungs, other parts of body or loss of life  Patient voiced understanding.)         Anesthesia Quick Evaluation

## 2022-10-25 ENCOUNTER — Ambulatory Visit
Admission: RE | Admit: 2022-10-25 | Payer: BC Managed Care – PPO | Source: Home / Self Care | Admitting: Gastroenterology

## 2022-10-25 SURGERY — COLONOSCOPY WITH PROPOFOL
Anesthesia: General

## 2022-10-30 NOTE — Progress Notes (Deleted)
Celso Amy, PA-C [address]  [address]  [place], [place] [address]  Main: [phone]  Fax: [phone]   Primary Care Physician: Bethanie Dicker, NP  Primary Gastroenterologist:  ***  CC:  HPI: [name] is a 47 y.o. female 6-month follow-up of abdominal pain, nausea, vomiting, and diarrhea.  CT abdomen pelvis with contrast 05/07/2022 showed no acute abnormality.  Mild dilation of intra and extrahepatic bile ducts postcholecystectomy.  Left kidney cyst.  Abdominal MRI confirmed bilateral benign kidney cyst.  Follow-up repeat kidney MRI was recommended in 1 year to ensure stability.  Normal liver.  EGD 10/03/2022 showed multiple duodenal erosions without bleeding, multiple diminutive gastric erosions without bleeding.  Biopsies negative for H. Pylori.  Colonoscopy 10/03/2022 by Dr. Allegra Lai showed poor prep with stool in the entire colon.  Repeat colonoscopy within 3 months with 2-day prep due to suboptimal prep.  She has had had 3 previous C-sections, abdominal hysterectomy, and cholecystectomy.   Lab 09/2022: Normal CBC.  (Hgb 13.9g). Lab 07/2022 Normal CBC, CMP, TSH and Lipase.   Has history of GERD and takes Prilosec 20 Mg daily.  Current smoker.  Rare alcohol use.  Has food allergies to bananas, cucumbers, and eggs which cause nausea and vomiting.   Previous EGD with EUS 05/2008 by Dr. Christella Hartigan at [facility] showed normal esophagus, stomach, and duodenum.  Common bile duct and pancreatic duct.  Gallbladder surgically absent.  Normal pancreas.  No etiology for abdominal pain found.   No previous colonoscopy.  Current Outpatient Medications  Medication Sig Dispense Refill   amLODipine (NORVASC) 10 MG tablet Take 1 tablet (10 mg total) by mouth daily. (Patient not taking: Reported on 10/17/2022) 30 tablet 11   dicyclomine (BENTYL) 10 MG capsule Take 1 capsule (10 mg total) by mouth 3 (three) times daily before meals. (Patient not taking: Reported on 10/03/2022) 90  capsule 2   ibuprofen (ADVIL,MOTRIN) 200 MG tablet Take 600-800 mg by mouth every 6 (six) hours as needed for mild pain. For pain     Multiple Vitamin (MULTIVITAMIN) capsule Take 1 capsule by mouth daily.     omeprazole (PRILOSEC) 40 MG capsule Take 1 capsule (40 mg total) by mouth daily before breakfast. 30 capsule 0   ondansetron (ZOFRAN-ODT) 4 MG disintegrating tablet Take 1 tablet (4 mg total) by mouth every 8 (eight) hours as needed for nausea or vomiting. 30 tablet 1   polyethylene glycol powder (MIRALAX) 17 GM/SCOOP powder Mix full container in 64 ounces of Gatorade or other clear liquid. No Red Liquids 238 g 0   No current facility-administered medications for this visit.    Allergies as of 10/31/2022 - Review Complete 10/17/2022  Allergen Reaction Noted   Banana Nausea And Vomiting 11/18/2021   Cucumber extract Nausea And Vomiting 11/18/2021   Egg-derived products Nausea And Vomiting 11/18/2021    Past Medical History:  Diagnosis Date   Allergy Couple years ago   GERD (gastroesophageal reflux disease) 1 year   Hypertension About a year   Pre-diabetes    Tobacco use disorder     Past Surgical History:  Procedure Laterality Date   ABDOMINAL HYSTERECTOMY     BIOPSY  10/03/2022   Procedure: BIOPSY;  Surgeon: Toney Reil, MD;  Location: [facility] ENDOSCOPY;  Service: Gastroenterology;;   CESAREAN SECTION     CHOLECYSTECTOMY     COLONOSCOPY WITH PROPOFOL N/A 10/03/2022   Procedure: COLONOSCOPY WITH PROPOFOL;  Surgeon: Toney Reil, MD;  Location: [facility] ENDOSCOPY;  Service: Gastroenterology;  Laterality: N/A;   ESOPHAGOGASTRODUODENOSCOPY (EGD) WITH PROPOFOL N/A 10/03/2022   Procedure: ESOPHAGOGASTRODUODENOSCOPY (EGD) WITH PROPOFOL;  Surgeon: Toney Reil, MD;  Location: [facility] ENDOSCOPY;  Service: Gastroenterology;  Laterality: N/A;   MANDIBLE FRACTURE SURGERY      Review of Systems:    All systems reviewed and negative except where noted in HPI.   Physical  Examination:   There were no vitals taken for this visit.  General: Well-nourished, well-developed in no acute distress.  Lungs: Clear to auscultation bilaterally. Non-labored. Heart: Regular rate and rhythm, no murmurs rubs or gallops.  Abdomen: Bowel sounds are normal; Abdomen is Soft; No hepatosplenomegaly, masses or hernias;  No Abdominal Tenderness; No guarding or rebound tenderness. Neuro: Alert and oriented x 3.  Grossly intact.  Psych: Alert and cooperative, normal mood and affect.   Imaging Studies: No results found.  Assessment and Plan:   [name] is a 47 y.o. y/o female   1.  Chronic idiopathic constipation.  Start Linzess  2.  Elevated bowel syndrome with constipation  Start Linzess  3.  Erosive gastritis (H. pylori negative),  Avoid NSAIDS  Take PPI.  4.  Erosive duodenitis  Avoid NSAIDS  Take PPI  5.  Colon cancer screening  Scheduling repeat colonoscopy with Dr. Allegra Lai  2-day prep  6.  Kidney cyst.; Stable  Recommend repeat MRI in 1 year to ensure stability.  Follow-up with PCP for this.   Celso Amy, PA-C  Follow up ***  BP check ***

## 2022-10-31 ENCOUNTER — Ambulatory Visit: Payer: BC Managed Care – PPO | Admitting: Physician Assistant

## 2022-12-28 ENCOUNTER — Ambulatory Visit: Payer: BC Managed Care – PPO | Admitting: Family Medicine

## 2022-12-28 ENCOUNTER — Ambulatory Visit: Payer: BC Managed Care – PPO | Admitting: Nurse Practitioner

## 2022-12-29 ENCOUNTER — Ambulatory Visit: Payer: BC Managed Care – PPO | Admitting: Nurse Practitioner

## 2023-03-01 ENCOUNTER — Other Ambulatory Visit: Payer: Self-pay | Admitting: Internal Medicine

## 2023-03-01 DIAGNOSIS — I158 Other secondary hypertension: Secondary | ICD-10-CM

## 2023-03-02 ENCOUNTER — Other Ambulatory Visit: Payer: Self-pay | Admitting: Internal Medicine

## 2023-03-02 DIAGNOSIS — I158 Other secondary hypertension: Secondary | ICD-10-CM

## 2023-03-02 NOTE — Telephone Encounter (Signed)
NOT [facility] PATIENT

## 2023-03-06 ENCOUNTER — Telehealth: Payer: Self-pay | Admitting: Nurse Practitioner

## 2023-03-06 NOTE — Telephone Encounter (Signed)
Prescription Request  03/06/2023  LOV: 08/01/2022  What is the name of the medication or equipment? amLODipine (NORVASC) 10 MG tablet   Have you contacted your pharmacy to request a refill? Yes   Which pharmacy would you like this sent to?  [id number] - [place], [place] - [address] [place] [address] Phone: [phone] Fax: [phone]    Patient notified that their request is being sent to the clinical staff for review and that they should receive a response within 2 business days.   Please advise at Mobile [phone] (mobile)

## 2023-03-12 ENCOUNTER — Other Ambulatory Visit: Payer: Self-pay

## 2023-03-12 ENCOUNTER — Encounter (HOSPITAL_COMMUNITY): Payer: Self-pay | Admitting: Emergency Medicine

## 2023-03-12 ENCOUNTER — Inpatient Hospital Stay (HOSPITAL_COMMUNITY)
Admission: EM | Admit: 2023-03-12 | Discharge: 2023-03-14 | DRG: 392 | Disposition: A | Discharge status: Home / Self Care | Payer: BC Managed Care – PPO | Attending: Internal Medicine | Admitting: Internal Medicine

## 2023-03-12 DIAGNOSIS — Z79899 Other long term (current) drug therapy: Secondary | ICD-10-CM

## 2023-03-12 DIAGNOSIS — Z6830 Body mass index (BMI) 30.0-30.9, adult: Secondary | ICD-10-CM

## 2023-03-12 DIAGNOSIS — Z91018 Allergy to other foods: Secondary | ICD-10-CM

## 2023-03-12 DIAGNOSIS — K56609 Unspecified intestinal obstruction, unspecified as to partial versus complete obstruction: Principal | ICD-10-CM | POA: Diagnosis present

## 2023-03-12 DIAGNOSIS — F17210 Nicotine dependence, cigarettes, uncomplicated: Secondary | ICD-10-CM | POA: Diagnosis present

## 2023-03-12 DIAGNOSIS — I10 Essential (primary) hypertension: Secondary | ICD-10-CM | POA: Diagnosis present

## 2023-03-12 DIAGNOSIS — Z833 Family history of diabetes mellitus: Secondary | ICD-10-CM

## 2023-03-12 DIAGNOSIS — Z91012 Allergy to eggs: Secondary | ICD-10-CM

## 2023-03-12 DIAGNOSIS — K529 Noninfective gastroenteritis and colitis, unspecified: Principal | ICD-10-CM | POA: Diagnosis present

## 2023-03-12 DIAGNOSIS — R7303 Prediabetes: Secondary | ICD-10-CM | POA: Diagnosis present

## 2023-03-12 DIAGNOSIS — Z9049 Acquired absence of other specified parts of digestive tract: Secondary | ICD-10-CM

## 2023-03-12 DIAGNOSIS — E669 Obesity, unspecified: Secondary | ICD-10-CM | POA: Diagnosis present

## 2023-03-12 DIAGNOSIS — K219 Gastro-esophageal reflux disease without esophagitis: Secondary | ICD-10-CM | POA: Diagnosis present

## 2023-03-12 DIAGNOSIS — K5652 Intestinal adhesions [bands] with complete obstruction: Principal | ICD-10-CM | POA: Diagnosis present

## 2023-03-12 DIAGNOSIS — Z8249 Family history of ischemic heart disease and other diseases of the circulatory system: Secondary | ICD-10-CM

## 2023-03-12 LAB — CBC
HCT: 41.3 % (ref 36.0–46.0)
Hemoglobin: 14 g/dL (ref 12.0–15.0)
MCH: 30.3 pg (ref 26.0–34.0)
MCHC: 33.9 g/dL (ref 30.0–36.0)
MCV: 89.4 fL (ref 80.0–100.0)
Platelets: 318 10*3/uL (ref 150–400)
RBC: 4.62 MIL/uL (ref 3.87–5.11)
RDW: 12.1 % (ref 11.5–15.5)
WBC: 14 10*3/uL — ABNORMAL HIGH (ref 4.0–10.5)
nRBC: 0 % (ref 0.0–0.2)

## 2023-03-12 LAB — COMPREHENSIVE METABOLIC PANEL
ALT: 29 U/L (ref 0–44)
AST: 53 U/L — ABNORMAL HIGH (ref 15–41)
Albumin: 3.6 g/dL (ref 3.5–5.0)
Alkaline Phosphatase: 47 U/L (ref 38–126)
Anion gap: 10 (ref 5–15)
BUN: 22 mg/dL — ABNORMAL HIGH (ref 6–20)
CO2: 22 mmol/L (ref 22–32)
Calcium: 8.2 mg/dL — ABNORMAL LOW (ref 8.9–10.3)
Chloride: 107 mmol/L (ref 98–111)
Creatinine, Ser: 0.59 mg/dL (ref 0.44–1.00)
GFR, Estimated: >60 mL/min (ref >60)
Glucose, Bld: 135 mg/dL — ABNORMAL HIGH (ref 70–99)
Potassium: 3.5 mmol/L (ref 3.5–5.1)
Sodium: 139 mmol/L (ref 135–145)
Total Bilirubin: 1.1 mg/dL (ref 0.0–1.2)
Total Protein: 6.8 g/dL (ref 6.5–8.1)

## 2023-03-12 LAB — LIPASE, BLOOD: Lipase: 140 U/L — ABNORMAL HIGH (ref 11–51)

## 2023-03-12 MED ORDER — SODIUM CHLORIDE 0.9 % IV BOLUS
500.0000 mL | Freq: Once | INTRAVENOUS | Status: AC
  Administered 2023-03-13: 500 mL via INTRAVENOUS

## 2023-03-12 MED ORDER — MORPHINE SULFATE (PF) 4 MG/ML IV SOLN
4.0000 mg | Freq: Once | INTRAVENOUS | Status: AC
  Administered 2023-03-13: 4 mg via INTRAVENOUS
  Filled 2023-03-12: qty 1

## 2023-03-12 MED ORDER — ONDANSETRON HCL 4 MG/2ML IJ SOLN
4.0000 mg | Freq: Once | INTRAMUSCULAR | Status: AC
  Administered 2023-03-13: 4 mg via INTRAVENOUS
  Filled 2023-03-12: qty 2

## 2023-03-12 NOTE — ED Triage Notes (Signed)
 Patient endorses epigastric pain and emesis that started this evening.  Patient has history of GI problems, but states this is different.

## 2023-03-12 NOTE — ED Provider Notes (Signed)
 [place] EMERGENCY DEPARTMENT AT [facility] Provider Note   CSN: [id number] Arrival date & time: 03/12/23  2158     History {Add pertinent medical, surgical, social history, OB history to HPI:1} Chief Complaint  Patient presents with   Abdominal Pain    [name] is a 48 y.o. female.   Abdominal Pain [name] is a 48 y.o. female who presents to the Emergency Department complaining of *** 9p nausea, diaphoresis. Severe epigastric pain. Lasted for 30 min. Radiates to back.  Ate at 5p.  No fever. One episode of diarrhea. No dysuria.  Hx/o HTN. Chole, partial hys  Former tobacco. No alcohol. No drugs.       Home Medications Prior to Admission medications   Medication Sig Start Date End Date Taking? Authorizing Provider  amLODipine (NORVASC) 10 MG tablet Take 1 tablet (10 mg total) by mouth daily. Patient not taking: Reported on 10/17/2022 11/30/21 11/30/22  Masters, Florentina Addison, DO  dicyclomine (BENTYL) 10 MG capsule Take 1 capsule (10 mg total) by mouth 3 (three) times daily before meals. Patient not taking: Reported on 10/03/2022 08/18/22 11/16/22  Celso Amy, PA-C  ibuprofen (ADVIL,MOTRIN) 200 MG tablet Take 600-800 mg by mouth every 6 (six) hours as needed for mild pain. For pain    [provider]  Multiple Vitamin (MULTIVITAMIN) capsule Take 1 capsule by mouth daily.    [provider]  omeprazole (PRILOSEC) 40 MG capsule Take 1 capsule (40 mg total) by mouth daily before breakfast. 10/05/22   Vanga, Loel Dubonnet, MD  ondansetron (ZOFRAN-ODT) 4 MG disintegrating tablet Take 1 tablet (4 mg total) by mouth every 8 (eight) hours as needed for nausea or vomiting. 08/29/22   Celso Amy, PA-C  polyethylene glycol powder (MIRALAX) 17 GM/SCOOP powder Mix full container in 64 ounces of Gatorade or other clear liquid. No Red Liquids 10/03/22   Vanga, Loel Dubonnet, MD  metoCLOPramide (REGLAN) 10 MG tablet Take 1 tablet (10 mg total) by mouth  every 6 (six) hours. Patient not taking: Reported on 05/19/2020 03/21/11 05/19/20  Elpidio Anis, PA-C      Allergies    Banana, Cucumber extract, and Egg-derived products    Review of Systems   Review of Systems  Gastrointestinal:  Positive for abdominal pain.  All other systems reviewed and are negative.   Physical Exam Updated Vital Signs Pulse 90   Temp 98.3 F (36.8 C) (Oral)   Resp 16   Wt 90 kg   SpO2 99%   BMI 30.17 kg/m  Physical Exam Vitals and nursing note reviewed.  Constitutional:      Appearance: She is well-developed.  HENT:     Head: Normocephalic and atraumatic.  Cardiovascular:     Rate and Rhythm: Normal rate and regular rhythm.  Pulmonary:     Effort: Pulmonary effort is normal. No respiratory distress.  Abdominal:     Palpations: Abdomen is soft.     Tenderness: There is no guarding or rebound.     Comments: Mild epigastric tenderness  Musculoskeletal:        General: No tenderness.  Skin:    General: Skin is warm and dry.  Neurological:     Mental Status: She is alert and oriented to person, place, and time.  Psychiatric:        Behavior: Behavior normal.     ED Results / Procedures / Treatments   Labs (all labs ordered are listed, but only abnormal results are displayed) Labs  Reviewed  LIPASE, BLOOD - Abnormal; Notable for the following components:      Result Value   Lipase 140 (*)    All other components within normal limits  COMPREHENSIVE METABOLIC PANEL - Abnormal; Notable for the following components:   Glucose, Bld 135 (*)    BUN 22 (*)    Calcium 8.2 (*)    AST 53 (*)    All other components within normal limits  CBC - Abnormal; Notable for the following components:   WBC 14.0 (*)    All other components within normal limits  URINALYSIS, ROUTINE W REFLEX MICROSCOPIC    EKG EKG Interpretation Date/Time:  Sunday March 12 2023 22:13:34 EST Ventricular Rate:  101 PR Interval:  142 QRS Duration:  94 QT  Interval:  352 QTC Calculation: 457 R Axis:   87  Text Interpretation: Sinus tachycardia Confirmed by Tilden Fossa [phone]) on 03/12/2023 11:48:35 PM  Radiology No results found.  Procedures Procedures  {Document cardiac monitor, telemetry assessment procedure when appropriate:1}  Medications Ordered in ED Medications - No data to display  ED Course/ Medical Decision Making/ A&P   {   Click here for ABCD2, HEART and other calculatorsREFRESH Note before signing :1}                              Medical Decision Making Amount and/or Complexity of Data Reviewed Labs: ordered.   ***  {Document critical care time when appropriate:1} {Document review of labs and clinical decision tools ie heart score, Chads2Vasc2 etc:1}  {Document your independent review of radiology images, and any outside records:1} {Document your discussion with family members, caretakers, and with consultants:1} {Document social determinants of health affecting pt's care:1} {Document your decision making why or why not admission, treatments were needed:1} Final Clinical Impression(s) / ED Diagnoses Final diagnoses:  None    Rx / DC Orders ED Discharge Orders     None

## 2023-03-13 ENCOUNTER — Inpatient Hospital Stay (HOSPITAL_COMMUNITY): Payer: BC Managed Care – PPO

## 2023-03-13 ENCOUNTER — Other Ambulatory Visit (HOSPITAL_COMMUNITY): Payer: Self-pay | Admitting: Radiology

## 2023-03-13 ENCOUNTER — Emergency Department (HOSPITAL_COMMUNITY): Payer: BC Managed Care – PPO

## 2023-03-13 DIAGNOSIS — Z833 Family history of diabetes mellitus: Secondary | ICD-10-CM | POA: Diagnosis not present

## 2023-03-13 DIAGNOSIS — Z79899 Other long term (current) drug therapy: Secondary | ICD-10-CM | POA: Diagnosis not present

## 2023-03-13 DIAGNOSIS — Z91012 Allergy to eggs: Secondary | ICD-10-CM | POA: Diagnosis not present

## 2023-03-13 DIAGNOSIS — K5652 Intestinal adhesions [bands] with complete obstruction: Secondary | ICD-10-CM | POA: Diagnosis present

## 2023-03-13 DIAGNOSIS — Z91018 Allergy to other foods: Secondary | ICD-10-CM | POA: Diagnosis not present

## 2023-03-13 DIAGNOSIS — R7303 Prediabetes: Secondary | ICD-10-CM | POA: Diagnosis present

## 2023-03-13 DIAGNOSIS — K56609 Unspecified intestinal obstruction, unspecified as to partial versus complete obstruction: Secondary | ICD-10-CM | POA: Diagnosis present

## 2023-03-13 DIAGNOSIS — I10 Essential (primary) hypertension: Secondary | ICD-10-CM | POA: Diagnosis present

## 2023-03-13 DIAGNOSIS — Z9049 Acquired absence of other specified parts of digestive tract: Secondary | ICD-10-CM | POA: Diagnosis not present

## 2023-03-13 DIAGNOSIS — E669 Obesity, unspecified: Secondary | ICD-10-CM | POA: Diagnosis present

## 2023-03-13 DIAGNOSIS — F17210 Nicotine dependence, cigarettes, uncomplicated: Secondary | ICD-10-CM | POA: Diagnosis present

## 2023-03-13 DIAGNOSIS — Z6830 Body mass index (BMI) 30.0-30.9, adult: Secondary | ICD-10-CM | POA: Diagnosis not present

## 2023-03-13 DIAGNOSIS — K529 Noninfective gastroenteritis and colitis, unspecified: Secondary | ICD-10-CM | POA: Diagnosis present

## 2023-03-13 DIAGNOSIS — Z8249 Family history of ischemic heart disease and other diseases of the circulatory system: Secondary | ICD-10-CM | POA: Diagnosis not present

## 2023-03-13 DIAGNOSIS — K219 Gastro-esophageal reflux disease without esophagitis: Secondary | ICD-10-CM | POA: Diagnosis present

## 2023-03-13 LAB — HEPATITIS PANEL, ACUTE
HCV Ab: NONREACTIVE
Hep A IgM: NONREACTIVE
Hep B C IgM: NONREACTIVE
Hepatitis B Surface Ag: NONREACTIVE

## 2023-03-13 LAB — CBC
HCT: 39.8 % (ref 36.0–46.0)
Hemoglobin: 13.4 g/dL (ref 12.0–15.0)
MCH: 30.6 pg (ref 26.0–34.0)
MCHC: 33.7 g/dL (ref 30.0–36.0)
MCV: 90.9 fL (ref 80.0–100.0)
Platelets: 303 10*3/uL (ref 150–400)
RBC: 4.38 MIL/uL (ref 3.87–5.11)
RDW: 12.2 % (ref 11.5–15.5)
WBC: 7.8 10*3/uL (ref 4.0–10.5)
nRBC: 0 % (ref 0.0–0.2)

## 2023-03-13 LAB — URINALYSIS, ROUTINE W REFLEX MICROSCOPIC
Bilirubin Urine: NEGATIVE
Glucose, UA: NEGATIVE mg/dL
Hgb urine dipstick: NEGATIVE
Ketones, ur: NEGATIVE mg/dL
Leukocytes,Ua: NEGATIVE
Nitrite: NEGATIVE
Protein, ur: NEGATIVE mg/dL
Specific Gravity, Urine: >1.046 — ABNORMAL HIGH (ref 1.005–1.030)
pH: 5 (ref 5.0–8.0)

## 2023-03-13 LAB — HIV ANTIBODY (ROUTINE TESTING W REFLEX): HIV Screen 4th Generation wRfx: NONREACTIVE

## 2023-03-13 LAB — COMPREHENSIVE METABOLIC PANEL
ALT: 423 U/L — ABNORMAL HIGH (ref 0–44)
AST: 667 U/L — ABNORMAL HIGH (ref 15–41)
Albumin: 3.6 g/dL (ref 3.5–5.0)
Alkaline Phosphatase: 72 U/L (ref 38–126)
Anion gap: 9 (ref 5–15)
BUN: 19 mg/dL (ref 6–20)
CO2: 22 mmol/L (ref 22–32)
Calcium: 7.9 mg/dL — ABNORMAL LOW (ref 8.9–10.3)
Chloride: 108 mmol/L (ref 98–111)
Creatinine, Ser: 0.54 mg/dL (ref 0.44–1.00)
GFR, Estimated: >60 mL/min (ref >60)
Glucose, Bld: 117 mg/dL — ABNORMAL HIGH (ref 70–99)
Potassium: 3.8 mmol/L (ref 3.5–5.1)
Sodium: 139 mmol/L (ref 135–145)
Total Bilirubin: 1.1 mg/dL (ref 0.0–1.2)
Total Protein: 6.8 g/dL (ref 6.5–8.1)

## 2023-03-13 LAB — MAGNESIUM: Magnesium: 1.9 mg/dL (ref 1.7–2.4)

## 2023-03-13 LAB — LIPASE, BLOOD: Lipase: 93 U/L — ABNORMAL HIGH (ref 11–51)

## 2023-03-13 LAB — PHOSPHORUS: Phosphorus: 3.6 mg/dL (ref 2.5–4.6)

## 2023-03-13 MED ORDER — MORPHINE SULFATE (PF) 2 MG/ML IV SOLN
2.0000 mg | INTRAVENOUS | Status: AC | PRN
  Administered 2023-03-13 (×2): 2 mg via INTRAVENOUS
  Filled 2023-03-13 (×2): qty 1

## 2023-03-13 MED ORDER — PHENOL 1.4 % MT LIQD
1.0000 | OROMUCOSAL | Status: DC | PRN
  Filled 2023-03-13: qty 177

## 2023-03-13 MED ORDER — IOHEXOL 300 MG/ML  SOLN
80.0000 mL | Freq: Once | INTRAMUSCULAR | Status: AC | PRN
  Administered 2023-03-13: 80 mL via INTRAVENOUS

## 2023-03-13 MED ORDER — HYDROMORPHONE HCL 1 MG/ML IJ SOLN
0.5000 mg | INTRAMUSCULAR | Status: AC | PRN
  Administered 2023-03-13 (×2): 0.5 mg via INTRAVENOUS
  Filled 2023-03-13 (×2): qty 1

## 2023-03-13 MED ORDER — LACTATED RINGERS IV SOLN: INTRAVENOUS | Status: DC

## 2023-03-13 MED ORDER — LORAZEPAM 0.5 MG PO TABS
0.5000 mg | ORAL_TABLET | Freq: Once | ORAL | Status: AC
  Administered 2023-03-13: 0.5 mg via ORAL
  Filled 2023-03-13: qty 1

## 2023-03-13 MED ORDER — FENTANYL CITRATE PF 50 MCG/ML IJ SOSY
25.0000 ug | PREFILLED_SYRINGE | Freq: Once | INTRAMUSCULAR | Status: AC
  Administered 2023-03-13: 25 ug via INTRAVENOUS
  Filled 2023-03-13: qty 1

## 2023-03-13 MED ORDER — KETOROLAC TROMETHAMINE 15 MG/ML IJ SOLN
15.0000 mg | Freq: Four times a day (QID) | INTRAMUSCULAR | Status: DC | PRN
  Administered 2023-03-13 – 2023-03-14 (×4): 15 mg via INTRAVENOUS
  Filled 2023-03-13 (×4): qty 1

## 2023-03-13 MED ORDER — DIATRIZOATE MEGLUMINE & SODIUM 66-10 % PO SOLN
90.0000 mL | Freq: Once | ORAL | Status: AC
  Administered 2023-03-13: 90 mL via NASOGASTRIC
  Filled 2023-03-13: qty 90

## 2023-03-13 MED ORDER — PANTOPRAZOLE SODIUM 40 MG IV SOLR
40.0000 mg | INTRAVENOUS | Status: DC
  Administered 2023-03-13 – 2023-03-14 (×2): 40 mg via INTRAVENOUS
  Filled 2023-03-13 (×2): qty 10

## 2023-03-13 MED ORDER — ONDANSETRON HCL 4 MG/2ML IJ SOLN
4.0000 mg | Freq: Four times a day (QID) | INTRAMUSCULAR | Status: DC | PRN
  Administered 2023-03-13: 4 mg via INTRAVENOUS
  Filled 2023-03-13: qty 2

## 2023-03-13 MED ORDER — BISACODYL 10 MG RE SUPP: 10.0000 mg | Freq: Every day | RECTAL | Status: DC | PRN

## 2023-03-13 MED ORDER — PANTOPRAZOLE SODIUM 40 MG IV SOLR
40.0000 mg | Freq: Once | INTRAVENOUS | Status: AC
  Administered 2023-03-13: 40 mg via INTRAVENOUS
  Filled 2023-03-13: qty 10

## 2023-03-13 MED ORDER — PROCHLORPERAZINE EDISYLATE 10 MG/2ML IJ SOLN: 5.0000 mg | INTRAMUSCULAR | Status: AC

## 2023-03-13 MED ORDER — HYDROMORPHONE HCL 1 MG/ML IJ SOLN
1.0000 mg | Freq: Once | INTRAMUSCULAR | Status: AC
  Administered 2023-03-13: 1 mg via INTRAVENOUS
  Filled 2023-03-13: qty 1

## 2023-03-13 MED ORDER — PROCHLORPERAZINE EDISYLATE 10 MG/2ML IJ SOLN
5.0000 mg | Freq: Four times a day (QID) | INTRAMUSCULAR | Status: DC | PRN
  Administered 2023-03-13 (×3): 5 mg via INTRAVENOUS
  Filled 2023-03-13 (×3): qty 2

## 2023-03-13 MED ORDER — ENOXAPARIN SODIUM 40 MG/0.4ML IJ SOSY
40.0000 mg | PREFILLED_SYRINGE | INTRAMUSCULAR | Status: DC
  Administered 2023-03-13 – 2023-03-14 (×2): 40 mg via SUBCUTANEOUS
  Filled 2023-03-13 (×2): qty 0.4

## 2023-03-13 MED ORDER — IOHEXOL 300 MG/ML  SOLN: 100.0000 mL | Freq: Once | INTRAMUSCULAR | Status: DC | PRN

## 2023-03-13 MED ORDER — KETAMINE HCL 50 MG/5ML IJ SOSY
0.3000 mg/kg | PREFILLED_SYRINGE | Freq: Once | INTRAMUSCULAR | Status: AC
  Administered 2023-03-13: 27 mg via INTRAVENOUS
  Filled 2023-03-13: qty 5

## 2023-03-13 MED ORDER — HYDROCODONE-ACETAMINOPHEN 5-325 MG PO TABS
1.0000 | ORAL_TABLET | Freq: Four times a day (QID) | ORAL | Status: DC | PRN
  Administered 2023-03-13: 1 via ORAL
  Filled 2023-03-13: qty 1

## 2023-03-13 NOTE — Plan of Care (Signed)
  Problem: Education: Goal: Knowledge of General Education information will improve Description: Including pain rating scale, medication(s)/side effects and non-pharmacologic comfort measures Outcome: Progressing   Problem: Health Behavior/Discharge Planning: Goal: Ability to manage health-related needs will improve Outcome: Progressing   Problem: Clinical Measurements: Goal: Ability to maintain clinical measurements within normal limits will improve Outcome: Progressing Goal: Will remain free from infection Outcome: Progressing Goal: Diagnostic test results will improve Outcome: Progressing Goal: Respiratory complications will improve Outcome: Progressing   Problem: Coping: Goal: Level of anxiety will decrease Outcome: Progressing   Problem: Pain Managment: Goal: General experience of comfort will improve and/or be controlled Outcome: Progressing   Problem: Safety: Goal: Ability to remain free from injury will improve Outcome: Progressing   Problem: Skin Integrity: Goal: Risk for impaired skin integrity will decrease Outcome: Progressing

## 2023-03-13 NOTE — Consult Note (Signed)
 Surgical Evaluation Requesting provider: Tilden Fossa MD  Chief Complaint: Abdominal pain  HPI: 48 year old woman with history of prediabetes, GERD, hypertension who presented to the emergency room yesterday evening with epigastric abdominal pain and emesis that began just after eating dinner yesterday.  The pain did radiate to her back and was waxing and waning in quality.  Did have 1 episode of diarrhea when the pain began.  Denies any similar prior episodes though she does have a history of abdominal pain which is different from the current episode.  She underwent workup in the ER including CMP/CBC. Initially had a leukocytosis to 14 but this is resolved this morning now 7.8. Lipase is mildly elevated, and AST/ALT are markedly elevated this morning.  CT scan demonstrates a low-grade small bowel obstruction with a transition in the central abdomen as well as constipation.  An NG tube has been placed and confirmation x-ray is pending, she reports there has been some improvement in her pain over the course of the night.  Previous abdominal surgery includes multiple C-sections, cholecystectomy, hysterectomy.  She reports over the last year she has intentionally lost about 50 pounds and has quit smoking as well as has quit alcohol use.  Allergies  Allergen Reactions   Banana Nausea And Vomiting   Cucumber Extract Nausea And Vomiting   Egg-Derived Products Nausea And Vomiting    Past Medical History:  Diagnosis Date   Allergy Couple years ago   GERD (gastroesophageal reflux disease) 1 year   Hypertension About a year   Pre-diabetes    Tobacco use disorder     Past Surgical History:  Procedure Laterality Date   ABDOMINAL HYSTERECTOMY     BIOPSY  10/03/2022   Procedure: BIOPSY;  Surgeon: Toney Reil, MD;  Location: [facility] ENDOSCOPY;  Service: Gastroenterology;;   CESAREAN SECTION     CHOLECYSTECTOMY     COLONOSCOPY WITH PROPOFOL N/A 10/03/2022   Procedure: COLONOSCOPY WITH PROPOFOL;   Surgeon: Toney Reil, MD;  Location: [facility] ENDOSCOPY;  Service: Gastroenterology;  Laterality: N/A;   ESOPHAGOGASTRODUODENOSCOPY (EGD) WITH PROPOFOL N/A 10/03/2022   Procedure: ESOPHAGOGASTRODUODENOSCOPY (EGD) WITH PROPOFOL;  Surgeon: Toney Reil, MD;  Location: [facility] ENDOSCOPY;  Service: Gastroenterology;  Laterality: N/A;   MANDIBLE FRACTURE SURGERY      Family History  Problem Relation Age of Onset   Hypertension Mother    Cancer Mother    Hypertension Maternal Grandmother    Cancer Maternal Grandmother    Cancer Maternal Grandfather    Hypertension Paternal Grandmother    Diabetes Paternal Grandmother     Social History   Socioeconomic History   Marital status: Married    Spouse name: Not on file   Number of children: Not on file   Years of education: Not on file   Highest education level: Not on file  Occupational History   Not on file  Tobacco Use   Smoking status: Every Day    Current packs/day: 0.50    Average packs/day: 0.5 packs/day for 15.0 years (7.5 ttl pk-yrs)    Types: Cigarettes   Smokeless tobacco: Never   Tobacco comments:    Soon, but not yet  Vaping Use   Vaping status: Never Used  Substance and Sexual Activity   Alcohol use: Yes    Comment: Every once in a blue moon   Drug use: No   Sexual activity: Yes  Other Topics Concern   Not on file  Social History Narrative   Not on file  Social Drivers of Corporate investment banker Strain: Not on file  Food Insecurity: No Food Insecurity (11/19/2021)   Hunger Vital Sign    Worried About Running Out of Food in the Last Year: Never true    Ran Out of Food in the Last Year: Never true  Transportation Needs: No Transportation Needs (11/19/2021)   PRAPARE - Administrator, Civil Service (Medical): No    Lack of Transportation (Non-Medical): No  Physical Activity: Not on file  Stress: Not on file  Social Connections: Unknown (05/28/2021)   Received from [facility]   Social Network    Social Network: Not on file    No current facility-administered medications on file prior to encounter.   Current Outpatient Medications on File Prior to Encounter  Medication Sig Dispense Refill   amLODipine (NORVASC) 10 MG tablet Take 1 tablet (10 mg total) by mouth daily. 30 tablet 11   ibuprofen (ADVIL,MOTRIN) 200 MG tablet Take 600-800 mg by mouth every 6 (six) hours as needed for mild pain. For pain     Multiple Vitamin (MULTIVITAMIN) capsule Take 1 capsule by mouth daily.     nicotine (NICODERM CQ - DOSED IN MG/24 HOURS) 14 mg/24hr patch Place 14 mg onto the skin daily.     omeprazole (PRILOSEC) 40 MG capsule Take 1 capsule (40 mg total) by mouth daily before breakfast. (Patient taking differently: Take 40 mg by mouth daily as needed (GERD).) 30 capsule 0   ondansetron (ZOFRAN-ODT) 4 MG disintegrating tablet Take 1 tablet (4 mg total) by mouth every 8 (eight) hours as needed for nausea or vomiting. 30 tablet 1   dicyclomine (BENTYL) 10 MG capsule Take 1 capsule (10 mg total) by mouth 3 (three) times daily before meals. (Patient not taking: Reported on 10/03/2022) 90 capsule 2   PEG 3350-KCl-NaBcb-NaCl-NaSulf (PEG-3350/ELECTROLYTES) 236 g SOLR Take 4,000 mLs by mouth once. (Patient not taking: Reported on 03/13/2023)     polyethylene glycol powder (MIRALAX) 17 GM/SCOOP powder Mix full container in 64 ounces of Gatorade or other clear liquid. No Red Liquids (Patient not taking: Reported on 03/13/2023) 238 g 0   [DISCONTINUED] metoCLOPramide (REGLAN) 10 MG tablet Take 1 tablet (10 mg total) by mouth every 6 (six) hours. (Patient not taking: Reported on 05/19/2020) 15 tablet 0    Review of Systems: a complete, 10pt review of systems was completed with pertinent positives and negatives as documented in the HPI  Physical Exam: Vitals:   03/13/23 0515 03/13/23 0530  BP:  115/80  Pulse: (!) 104 100  Resp:    Temp:    SpO2: 94% 93%   Gen: A&Ox3, no distress   Chest: respiratory effort is normal.  Cardiovascular: RRR with palpable distal pulses, no pedal edema Gastrointestinal: soft, nondistended, nontender. No mass, hepatomegaly or splenomegaly. Muscoloskeletal: no clubbing or cyanosis of the fingers.  Strength is symmetrical throughout.  Range of motion of bilateral upper and lower extremities normal without pain, crepitation or contracture. Neuro: cranial nerves grossly intact.  Sensation intact to light touch diffusely. Psych: appropriate mood and affect, normal insight/judgment intact  Skin: warm and dry      Latest Ref Rng & Units 03/13/2023    5:00 AM 03/12/2023   10:21 PM 09/29/2022   11:03 AM  CBC  WBC 4.0 - 10.5 K/uL 7.8  14.0  8.9   Hemoglobin 12.0 - 15.0 g/dL 03.4  74.2  59.5   Hematocrit 36.0 - 46.0 % 39.8  41.3  41.9   Platelets 150 - 400 K/uL 303  318  329.0        Latest Ref Rng & Units 03/13/2023    5:00 AM 03/12/2023   10:21 PM 08/01/2022    9:02 AM  CMP  Glucose 70 - 99 mg/dL 732  202  91   BUN 6 - 20 mg/dL 19  22  20    Creatinine 0.44 - 1.00 mg/dL 5.42  7.06  2.37   Sodium 135 - 145 mmol/L 139  139  137   Potassium 3.5 - 5.1 mmol/L 3.8  3.5  4.1   Chloride 98 - 111 mmol/L 108  107  103   CO2 22 - 32 mmol/L 22  22  24    Calcium 8.9 - 10.3 mg/dL 7.9  8.2  9.8   Total Protein 6.5 - 8.1 g/dL 6.8  6.8  7.9   Total Bilirubin 0.0 - 1.2 mg/dL 1.1  1.1  0.6   Alkaline Phos 38 - 126 U/L 72  47  38   AST 15 - 41 U/L 667  53  17   ALT 0 - 44 U/L 423  29  13     No results found for: "INR", "PROTIME"  Imaging: DG Abd Portable 1V-Small Bowel Protocol-Position Verification Result Date: 03/13/2023 CLINICAL DATA:  48 year old female enteric tube placement. EXAM: PORTABLE ABDOMEN - 1 VIEW COMPARISON:  CT Abdomen and Pelvis 0111 hours today. FINDINGS: Portable AP view at 0538 hours. Enteric tube placed into the left upper quadrant with side hole at the level of the gastric body. Stomach seems less distended compared to the  earlier CT. Lung bases appear negative. Stable cholecystectomy clips. IMPRESSION: Satisfactory enteric tube placement into the stomach. Electronically Signed   By: Odessa Fleming M.D.   On: 03/13/2023 05:58   CT ABDOMEN PELVIS W CONTRAST Result Date: 03/13/2023 CLINICAL DATA:  Pancreatitis, acute, severe. EXAM: CT ABDOMEN AND PELVIS WITH CONTRAST TECHNIQUE: Multidetector CT imaging of the abdomen and pelvis was performed using the standard protocol following bolus administration of intravenous contrast. RADIATION DOSE REDUCTION: This exam was performed according to the departmental dose-optimization program which includes automated exposure control, adjustment of the mA and/or kV according to patient size and/or use of iterative reconstruction technique. CONTRAST:  80mL OMNIPAQUE IOHEXOL 300 MG/ML  SOLN COMPARISON:  MRI abdomen 08/25/2022, CT abdomen pelvis with IV contrast 05/16/2022, CTA chest, abdomen and pelvis 11/18/2021. FINDINGS: Lower chest: The lower lobes demonstrate mosaicism which could be due to air trapping and small airway disease or a low inspiration. The lung bases are otherwise clear.  The cardiac size is normal. Hepatobiliary: Stable mild post cholecystectomy biliary prominence both intrahepatic and extrahepatic common bile duct is 10 mm as before. The liver is unremarkable. Pancreas: Unremarkable. No pancreatic ductal dilatation or surrounding inflammatory changes. Spleen: Normal. Adrenals/Urinary Tract: There is no adrenal mass. Cystic lesions in the superior pole of the right kidney again measure 1.1 cm and 1 cm. 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 Follow-up MRI was recommended in 1 year therefore MRI is recommended in the latter part of August of this year. There is no interval change in size. There is no solid mass enhancement of either kidney. There is no urinary stone or obstruction. No  bladder thickening is seen. Stomach/Bowel: There is mild gastric distention with fluid and food products. Mild dilatation of ileal small  bowel in the mid to lower abdomen up to 2.7 cm. The transitional segment is noted in the anterior central lower abdomen on 2:70 and 71 where there is a sharp hairpin turn involving a small bowel loop. Etiology could be adhesions or enteritis as there is some wall prominence at the hairpin turn. Findings are consistent with low-grade small-bowel obstruction with normal caliber of the remaining small bowel. The appendix is normal. There is moderate retained stool. No colonic thickening or inflammatory change. Vascular/Lymphatic: Aortic atherosclerosis. No enlarged abdominal or pelvic lymph nodes. Reproductive: Status post hysterectomy. No adnexal masses. Other: Small umbilical fat hernia. No incarcerated hernia. No free air, free hemorrhage or free fluid. Musculoskeletal: No acute or significant osseous findings. IMPRESSION: 1. Low-grade small-bowel obstruction with transitional segment in the anterior central lower abdomen where there is a sharp hairpin turn involving a small bowel loop. Etiology could be adhesions or enteritis. No adjacent mesenteric edema. 2. Constipation. 3. Stable mild post cholecystectomy biliary prominence. 4. 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 Follow-up MRI was recommended in 1 year in the prior report therefore MRI is recommended in the latter part of August of this year. 5. Aortic atherosclerosis. 6. 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 Aortic Atherosclerosis (ICD10-I70.0). Electronically Signed   By: Almira Bar M.D.   On: 03/13/2023 02:26     A/P: 48 year old woman who presents with epigastric abdominal pain and CT imaging concerning for a low-grade small bowel  obstruction.  Lab work also notable for transaminitis and mildly elevated lipase.  Agree with current plans for fluid resuscitation, pain and nausea control, NG decompression small bowel obstruction protocol.  Would add hepatitis panel given transaminitis and continue to follow these labs, possible GI consult if worsening.  Surgery team will follow.    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

## 2023-03-13 NOTE — Hospital Course (Addendum)
 48 year old female with HTN prediabetes, GERD former tobacco abuse,/alcohol use history of cholecystectomy/hysterectomy presented to the ED with epigastric pain started after eating dinner 2/23.  In the ED labs showed significant leukocytosis lipase at 140 AST 53 CT abdomen finding concerning for low-grade small bowel obstruction with transitional segment in the anterior central lower abdomen, constipation Patient was given several rounds of IV opiate medication for pain antiemetics General Surgery was consulted and admitted under [facility] for further management Managed w/ NG tube decompression subsequently placed a clamp trial, small bowel follow-through, at this time resolved.  Tolerating diet, advance to soft diet if tolerating she will be discharged home.  She will follow-up with PCP and her outpatient physician her potassium was replaced.  Her LFTs are elevated but they are now downtrending, she will need recheck and LFTs in 5 to 7 days upon d/c.

## 2023-03-13 NOTE — H&P (Addendum)
 History and Physical  [name] YNW:[id number] DOB: Jan 12, 1976 DOA: 03/12/2023  Referring physician: Dr. Madilyn Hook, EDP  PCP: Bethanie Dicker, NP  Outpatient Specialists: GI, nephrology. Patient coming from: Home.  Chief Complaint: Sudden onset epigastric pain, nausea and vomiting  HPI: [name] is a 48 y.o. female with medical history significant for prediabetes, GERD, hypertension, former tobacco and alcohol user (quit a year ago), history of cholecystectomy, who presents to the ER due to sudden onset epigastric pain radiating to her back, that started last night after eating dinner.  No reported subjective fevers or chills.  Last bowel movement was last night and it was watery.  She presented to the ER for further evaluation.  In the ER, afebrile, however lab work is significant for leukocytosis 14K, elevated lipase 140 and AST 53.  A contrast-enhanced CT scan of abdomen pelvis revealed low-grade small bowel obstruction with transitional segment in the anterior central lower abdomen where there is a sharp hairpin.  Involving small bowel loop.  Etiology could be adhesions or enteritis.  No adjacent mesenteric edema.  Constipation.  The patient received several rounds of IV opioid-based analgesics and IV antiemetics.  EDP consulted general surgery.  TRH, hospitalist service was asked to admit for management of SBO.  ED Course: Temperature 98.3.  BP 118/64, pulse 95, respiratory rate 16, O2 saturation 96% on room air.  Review of Systems: Review of systems as noted in the HPI. All other systems reviewed and are negative.   Past Medical History:  Diagnosis Date   Allergy Couple years ago   GERD (gastroesophageal reflux disease) 1 year   Hypertension About a year   Pre-diabetes    Tobacco use disorder    Past Surgical History:  Procedure Laterality Date   ABDOMINAL HYSTERECTOMY     BIOPSY  10/03/2022   Procedure: BIOPSY;  Surgeon: Toney Reil, MD;  Location: [facility]  ENDOSCOPY;  Service: Gastroenterology;;   CESAREAN SECTION     CHOLECYSTECTOMY     COLONOSCOPY WITH PROPOFOL N/A 10/03/2022   Procedure: COLONOSCOPY WITH PROPOFOL;  Surgeon: Toney Reil, MD;  Location: [facility] ENDOSCOPY;  Service: Gastroenterology;  Laterality: N/A;   ESOPHAGOGASTRODUODENOSCOPY (EGD) WITH PROPOFOL N/A 10/03/2022   Procedure: ESOPHAGOGASTRODUODENOSCOPY (EGD) WITH PROPOFOL;  Surgeon: Toney Reil, MD;  Location: [facility] ENDOSCOPY;  Service: Gastroenterology;  Laterality: N/A;   MANDIBLE FRACTURE SURGERY      Social History:  reports that she has been smoking cigarettes. She has a 7.5 pack-year smoking history. She has never used smokeless tobacco. She reports current alcohol use. She reports that she does not use drugs.   Allergies  Allergen Reactions   Banana Nausea And Vomiting   Cucumber Extract Nausea And Vomiting   Egg-Derived Products Nausea And Vomiting    Family History  Problem Relation Age of Onset   Hypertension Mother    Cancer Mother    Hypertension Maternal Grandmother    Cancer Maternal Grandmother    Cancer Maternal Grandfather    Hypertension Paternal Grandmother    Diabetes Paternal Grandmother       Prior to Admission medications   Medication Sig Start Date End Date Taking? Authorizing Provider  PEG 3350-KCl-NaBcb-NaCl-NaSulf (PEG-3350/ELECTROLYTES) 236 g SOLR Take 4,000 mLs by mouth once. 10/03/22  Yes [provider]  amLODipine (NORVASC) 10 MG tablet Take 1 tablet (10 mg total) by mouth daily. Patient not taking: Reported on 10/17/2022 11/30/21 11/30/22  Masters, Florentina Addison, DO  dicyclomine (BENTYL) 10 MG capsule Take 1 capsule (  10 mg total) by mouth 3 (three) times daily before meals. Patient not taking: Reported on 10/03/2022 08/18/22 11/16/22  Celso Amy, PA-C  ibuprofen (ADVIL,MOTRIN) 200 MG tablet Take 600-800 mg by mouth every 6 (six) hours as needed for mild pain. For pain    [provider]  Multiple Vitamin  (MULTIVITAMIN) capsule Take 1 capsule by mouth daily.    [provider]  omeprazole (PRILOSEC) 40 MG capsule Take 1 capsule (40 mg total) by mouth daily before breakfast. 10/05/22   Vanga, Loel Dubonnet, MD  ondansetron (ZOFRAN-ODT) 4 MG disintegrating tablet Take 1 tablet (4 mg total) by mouth every 8 (eight) hours as needed for nausea or vomiting. 08/29/22   Celso Amy, PA-C  polyethylene glycol powder (MIRALAX) 17 GM/SCOOP powder Mix full container in 64 ounces of Gatorade or other clear liquid. No Red Liquids 10/03/22   Vanga, Loel Dubonnet, MD  metoCLOPramide (REGLAN) 10 MG tablet Take 1 tablet (10 mg total) by mouth every 6 (six) hours. Patient not taking: Reported on 05/19/2020 03/21/11 05/19/20  Elpidio Anis, PA-C    Physical Exam: Pulse 90   Temp 98.3 F (36.8 C) (Oral)   Resp 16   Wt 90 kg   SpO2 99%   BMI 30.17 kg/m   General: 48 y.o. year-old female well developed well nourished in no acute distress.  Alert and oriented x3. Cardiovascular: Regular rate and rhythm with no rubs or gallops.  No thyromegaly or JVD noted.  No lower extremity edema. 2/4 pulses in all 4 extremities. Respiratory: Clear to auscultation with no wheezes or rales. Good inspiratory effort. Abdomen: Soft epigastric tenderness nondistended with hypoactive bowel sounds x4 quadrants. Muskuloskeletal: No cyanosis, clubbing or edema noted bilaterally Neuro: CN II-XII intact, strength, sensation, reflexes Skin: No ulcerative lesions noted or rashes Psychiatry: Judgement and insight appear normal. Mood is appropriate for condition and setting          Labs on Admission:  Basic Metabolic Panel: Recent Labs  Lab 03/12/23 2221  NA 139  K 3.5  CL 107  CO2 22  GLUCOSE 135*  BUN 22*  CREATININE 0.59  CALCIUM 8.2*   Liver Function Tests: Recent Labs  Lab 03/12/23 2221  AST 53*  ALT 29  ALKPHOS 47  BILITOT 1.1  PROT 6.8  ALBUMIN 3.6   Recent Labs  Lab 03/12/23 2221  LIPASE 140*   No  results for input(s): "AMMONIA" in the last 168 hours. CBC: Recent Labs  Lab 03/12/23 2221  WBC 14.0*  HGB 14.0  HCT 41.3  MCV 89.4  PLT 318   Cardiac Enzymes: No results for input(s): "CKTOTAL", "CKMB", "CKMBINDEX", "TROPONINI" in the last 168 hours.  BNP (last 3 results) No results for input(s): "BNP" in the last 8760 hours.  ProBNP (last 3 results) No results for input(s): "PROBNP" in the last 8760 hours.  CBG: No results for input(s): "GLUCAP" in the last 168 hours.  Radiological Exams on Admission: CT ABDOMEN PELVIS W CONTRAST Result Date: 03/13/2023 CLINICAL DATA:  Pancreatitis, acute, severe. EXAM: CT ABDOMEN AND PELVIS WITH CONTRAST TECHNIQUE: Multidetector CT imaging of the abdomen and pelvis was performed using the standard protocol following bolus administration of intravenous contrast. RADIATION DOSE REDUCTION: This exam was performed according to the departmental dose-optimization program which includes automated exposure control, adjustment of the mA and/or kV according to patient size and/or use of iterative reconstruction technique. CONTRAST:  80mL OMNIPAQUE IOHEXOL 300 MG/ML  SOLN COMPARISON:  MRI abdomen 08/25/2022, CT abdomen pelvis  with IV contrast 05/16/2022, CTA chest, abdomen and pelvis 11/18/2021. FINDINGS: Lower chest: The lower lobes demonstrate mosaicism which could be due to air trapping and small airway disease or a low inspiration. The lung bases are otherwise clear.  The cardiac size is normal. Hepatobiliary: Stable mild post cholecystectomy biliary prominence both intrahepatic and extrahepatic common bile duct is 10 mm as before. The liver is unremarkable. Pancreas: Unremarkable. No pancreatic ductal dilatation or surrounding inflammatory changes. Spleen: Normal. Adrenals/Urinary Tract: There is no adrenal mass. Cystic lesions in the superior pole of the right kidney again measure 1.1 cm and 1 cm. 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 Follow-up MRI was recommended in 1 year therefore MRI is recommended in the latter part of August of this year. There is no interval change in size. There is no solid mass enhancement of either kidney. There is no urinary stone or obstruction. No bladder thickening is seen. Stomach/Bowel: There is mild gastric distention with fluid and food products. Mild dilatation of ileal small bowel in the mid to lower abdomen up to 2.7 cm. The transitional segment is noted in the anterior central lower abdomen on 2:70 and 71 where there is a sharp hairpin turn involving a small bowel loop. Etiology could be adhesions or enteritis as there is some wall prominence at the hairpin turn. Findings are consistent with low-grade small-bowel obstruction with normal caliber of the remaining small bowel. The appendix is normal. There is moderate retained stool. No colonic thickening or inflammatory change. Vascular/Lymphatic: Aortic atherosclerosis. No enlarged abdominal or pelvic lymph nodes. Reproductive: Status post hysterectomy. No adnexal masses. Other: Small umbilical fat hernia. No incarcerated hernia. No free air, free hemorrhage or free fluid. Musculoskeletal: No acute or significant osseous findings. IMPRESSION: 1. Low-grade small-bowel obstruction with transitional segment in the anterior central lower abdomen where there is a sharp hairpin turn involving a small bowel loop. Etiology could be adhesions or enteritis. No adjacent mesenteric edema. 2. Constipation. 3. Stable mild post cholecystectomy biliary prominence. 4. 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 Follow-up MRI was recommended in 1 year in the prior report therefore MRI is recommended in the latter part of August of this year. 5. Aortic atherosclerosis. 6. 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 Aortic Atherosclerosis (ICD10-I70.0). Electronically Signed   By: Almira Bar M.D.   On: 03/13/2023 02:26    EKG: I independently viewed the EKG done and my findings are as followed: Sinus tachycardia rate of 101.  Nonspecific ST-T changes.  QTC 457.  Assessment/Plan Present on Admission:  SBO (small bowel obstruction) (HCC)  Principal Problem:   SBO (small bowel obstruction) (HCC)  Small bowel obstruction, POA, seen on CT scan Previous abdominal surgeries as risk factors SBO protocol is in place with oral contrast Follow abdominal x-ray in the morning Optimize magnesium and potassium levels IV fluid hydration Early mobilization  Sudden onset epigastric pain  Borderline elevated lipase 140, repeat level Pancreas was unremarkable on contrast CT abdomen and pelvis Denies recent use of alcohol or tobacco Repeat lipase level, if at least 3 times above the upper limit, then treat for acute pancreatitis. As needed IV analgesics and as needed IV antiemetics  Intractable nausea and  vomiting in the setting of the above Plan for NG tube placement with SBO protocol N.p.o. except for ice chips until seen by general surgery. As needed IV antiemetics  GERD IV PPI daily while NPO  Prediabetes Last A1c 5.8 on 08/01/2022. Hold off home oral hypoglycemics and monitor for now N.p.o. due to SBO.  Obesity BMI 30 Recommend weight loss outpatient with regular physical activity and healthy dieting.  Former tobacco/alcohol user Encouraged to stay tobacco and alcohol free   Time: 75 minutes.   DVT prophylaxis: Subcu Lovenox daily.  Code Status: Full code.  Family Communication: None at bedside.  Disposition Plan: Admitted to MedSurg unit.  Consults called: None  Admission status: Inpatient status.   Status is: Inpatient The patient requires at least 2 midnights for further evaluation  and treatment of present condition.   Darlin Drop MD [facility] Pager [phone]  If 7PM-7AM, please contact night-coverage [email or web address] Password [facility]  03/13/2023, 3:33 AM

## 2023-03-13 NOTE — ED Notes (Signed)
 Pt tolerated NG tube placement, awaiting x-ray to verify, pt medicated per [facility], denies other needs at this time

## 2023-03-13 NOTE — Progress Notes (Signed)
 Patient seen and examined personally, I reviewed the chart, history and physical and admission note, done by admitting physician this morning and agree with the same with following addendum.  Please refer to the morning admission note for more detailed plan of care.  Briefly,  48 year old female with HTN prediabetes, GERD former tobacco abuse,/alcohol use history of cholecystectomy/hysterectomy presented to the ED with epigastric pain started after eating dinner 2/23.  In the ED labs showed significant leukocytosis lipase at 140 AST 53 CT abdomen finding concerning for low-grade small bowel obstruction with transitional segment in the anterior central lower abdomen, constipation Patient was given several rounds of IV opiate medication for pain antiemetics General Surgery was consulted and admitted under [facility] for further management   Patient reports she has less abdominal pain, currently NG tube is clamped this morning by surgery No nausea or vomiting  A/P Small bowel obstruction POA Abdominal pain Constipation: Presented with sudden onset of abdominal pain with background of previous abdominal surgeries, CT imaging concerning for low-grade SBO-continue plan of care as per [facility] with IV fluids, antiemetics, pain management, NG decompression and small bowel protocol  Transaminitis: Significant bump in LFTs overnight ,heck hepatitis panel.  CT showed stable mild postcholecystectomy biliary prominence intrahepatic and extrahepatic CBD stent millimeter as before and liver is unremarkable.  Trend. If worse wil ask GI to see. Recent Labs  Lab 03/12/23 2221 03/13/23 0500  AST 53* 667*  ALT 29 423*  ALKPHOS 47 72  BILITOT 1.1 1.1  PROT 6.8 6.8  ALBUMIN 3.6 3.6    GERD: Continue PPI  Prediabetes: Monitor

## 2023-03-13 NOTE — ED Notes (Signed)
 X-ray resulted at 0558 , pt placed on intermittent low suction at that time

## 2023-03-14 ENCOUNTER — Inpatient Hospital Stay (HOSPITAL_COMMUNITY): Payer: BC Managed Care – PPO

## 2023-03-14 DIAGNOSIS — K56609 Unspecified intestinal obstruction, unspecified as to partial versus complete obstruction: Secondary | ICD-10-CM | POA: Diagnosis not present

## 2023-03-14 LAB — CBC
HCT: 35.2 % — ABNORMAL LOW (ref 36.0–46.0)
Hemoglobin: 11.6 g/dL — ABNORMAL LOW (ref 12.0–15.0)
MCH: 30.4 pg (ref 26.0–34.0)
MCHC: 33 g/dL (ref 30.0–36.0)
MCV: 92.4 fL (ref 80.0–100.0)
Platelets: 221 10*3/uL (ref 150–400)
RBC: 3.81 MIL/uL — ABNORMAL LOW (ref 3.87–5.11)
RDW: 12.5 % (ref 11.5–15.5)
WBC: 6.2 10*3/uL (ref 4.0–10.5)
nRBC: 0 % (ref 0.0–0.2)

## 2023-03-14 LAB — COMPREHENSIVE METABOLIC PANEL
ALT: 379 U/L — ABNORMAL HIGH (ref 0–44)
AST: 236 U/L — ABNORMAL HIGH (ref 15–41)
Albumin: 3.1 g/dL — ABNORMAL LOW (ref 3.5–5.0)
Alkaline Phosphatase: 76 U/L (ref 38–126)
Anion gap: 9 (ref 5–15)
BUN: 22 mg/dL — ABNORMAL HIGH (ref 6–20)
CO2: 24 mmol/L (ref 22–32)
Calcium: 8 mg/dL — ABNORMAL LOW (ref 8.9–10.3)
Chloride: 108 mmol/L (ref 98–111)
Creatinine, Ser: 0.47 mg/dL (ref 0.44–1.00)
GFR, Estimated: >60 mL/min (ref >60)
Glucose, Bld: 74 mg/dL (ref 70–99)
Potassium: 3.3 mmol/L — ABNORMAL LOW (ref 3.5–5.1)
Sodium: 141 mmol/L (ref 135–145)
Total Bilirubin: 1 mg/dL (ref 0.0–1.2)
Total Protein: 5.9 g/dL — ABNORMAL LOW (ref 6.5–8.1)

## 2023-03-14 MED ORDER — POTASSIUM CHLORIDE CRYS ER 20 MEQ PO TBCR
20.0000 meq | EXTENDED_RELEASE_TABLET | Freq: Every day | ORAL | Status: DC
  Administered 2023-03-14: 20 meq via ORAL
  Filled 2023-03-14: qty 1

## 2023-03-14 MED ORDER — TRAMADOL HCL 50 MG PO TABS: 50.0000 mg | ORAL_TABLET | Freq: Two times a day (BID) | ORAL | 0 refills | Status: DC | PRN

## 2023-03-14 MED ORDER — POTASSIUM CHLORIDE CRYS ER 20 MEQ PO TBCR
40.0000 meq | EXTENDED_RELEASE_TABLET | Freq: Once | ORAL | Status: AC
  Administered 2023-03-14: 40 meq via ORAL
  Filled 2023-03-14: qty 2

## 2023-03-14 MED ORDER — ONDANSETRON 4 MG PO TBDP
4.0000 mg | ORAL_TABLET | Freq: Three times a day (TID) | ORAL | 1 refills | Status: DC | PRN
Start: 2023-03-14 — End: 2023-07-20

## 2023-03-14 NOTE — Discharge Summary (Signed)
 Physician Discharge Summary  [name] WUJ:[id number] DOB: 1975-10-08 DOA: 03/12/2023  PCP: Bethanie Dicker, NP  Admit date: 03/12/2023 Discharge date: 03/14/2023 Recommendations for Outpatient Follow-up:  Follow up with PCP in 1 weeks-call for appointment Please obtain BMP/CBC in one week  Discharge Dispo: [facility] Discharge Condition: Stable Code Status:   Code Status: Full Code Diet recommendation:  Diet Order             DIET SOFT Room service appropriate? Yes; Fluid consistency: Thin  Diet effective now                    Brief/Interim Summary: 48 year old female with HTN prediabetes, GERD former tobacco abuse,/alcohol use history of cholecystectomy/hysterectomy presented to the ED with epigastric pain started after eating dinner 2/23.  In the ED labs showed significant leukocytosis lipase at 140 AST 53 CT abdomen finding concerning for low-grade small bowel obstruction with transitional segment in the anterior central lower abdomen, constipation Patient was given several rounds of IV opiate medication for pain antiemetics General Surgery was consulted and admitted under [facility] for further management Managed w/ NG tube decompression subsequently placed a clamp trial, small bowel follow-through, at this time resolved.  Tolerating diet, advance to soft diet if tolerating she will be discharged home.  She will follow-up with PCP and her outpatient physician her potassium was replaced.  Her LFTs are elevated but they are now downtrending, she will need recheck and LFTs in 5 to 7 days upon d/c.    Discharge Diagnoses:  Principal Problem:   SBO (small bowel obstruction) (HCC)  Assessment and Plan: No notes have been filed under this hospital service. Service: Hospitalist   Small bowel obstruction POA Abdominal pain Constipation enteritis: Managed w/ NG tube decompression subsequently placed a clamp trial, small bowel follow-through, at this time resolved.  Tolerating diet,  advanced to soft diet-which patient tolerated well discussed with surgery and okay for discharge we will send her on some Zofran and tramadol   Transaminitis Mildly elevated lipase-downtrending: Significant bump in LFTs acute hepatitis panel negative.  LFTs now downtrending-AST/ALT/TB= 667/423/2.2> 236/379/1   CT showed stable mild postcholecystectomy biliary prominence intrahepatic and extrahepatic CBD  100 mm  as before and liver is unremarkable. Common bile duct measures up to 12 mm in ct on 05/16/2022  Consults: cs Subjective: Alert awake oriented tolerating diet  Discharge Exam: Vitals:   03/14/23 0616 03/14/23 1324  BP: 111/64 105/68  Pulse: 65 67  Resp: 18 17  Temp: 98.3 F (36.8 C) 98.2 F (36.8 C)  SpO2: 98% 94%   General: Pt is alert, awake, not in acute distress Cardiovascular: RRR, S1/S2 +, no rubs, no gallops Respiratory: CTA bilaterally, no wheezing, no rhonchi Abdominal: Soft, NT, ND, bowel sounds + Extremities: no edema, no cyanosis  Discharge Instructions  Discharge Instructions     Discharge instructions   Complete by: As directed    Please call call MD or return to ER for similar or worsening recurring problem that brought you to hospital or if any fever,nausea/vomiting,abdominal pain, uncontrolled pain, chest pain,  shortness of breath or any other alarming symptoms.  CHECK LFTS In 1 WEEK  Please follow-up your doctor as instructed in a week time and call the office for appointment.  Please avoid alcohol, smoking, or any other illicit substance and maintain healthy habits including taking your regular medications as prescribed.  You were cared for by a hospitalist during your hospital stay. If you have any questions about  your discharge medications or the care you received while you were in the hospital after you are discharged, you can call the unit and ask to speak with the hospitalist on call if the hospitalist that took care of you is not  available.  Once you are discharged, your primary care physician will handle any further medical issues. Please note that NO REFILLS for any discharge medications will be authorized once you are discharged, as it is imperative that you return to your primary care physician (or establish a relationship with a primary care physician if you do not have one) for your aftercare needs so that they can reassess your need for medications and monitor your lab values   Increase activity slowly   Complete by: As directed       Allergies as of 03/14/2023       Reactions   Banana Nausea And Vomiting   Cucumber Extract Nausea And Vomiting   Egg-derived Products Nausea And Vomiting        Medication List     TAKE these medications    amLODipine 10 MG tablet Commonly known as: NORVASC Take 1 tablet (10 mg total) by mouth daily.   dicyclomine 10 MG capsule Commonly known as: BENTYL Take 1 capsule (10 mg total) by mouth 3 (three) times daily before meals.   ibuprofen 200 MG tablet Commonly known as: ADVIL Take 600-800 mg by mouth every 6 (six) hours as needed for mild pain. For pain   multivitamin capsule Take 1 capsule by mouth daily.   nicotine 14 mg/24hr patch Commonly known as: NICODERM CQ - dosed in mg/24 hours Place 14 mg onto the skin daily.   omeprazole 40 MG capsule Commonly known as: PRILOSEC Take 1 capsule (40 mg total) by mouth daily before breakfast. What changed:  when to take this reasons to take this   ondansetron 4 MG disintegrating tablet Commonly known as: ZOFRAN-ODT Take 1 tablet (4 mg total) by mouth every 8 (eight) hours as needed for nausea or vomiting.   traMADol 50 MG tablet Commonly known as: Ultram Take 1 tablet (50 mg total) by mouth every 12 (twelve) hours as needed for up to 10 doses.        Follow-up Information     Bethanie Dicker, NP Follow up in 1 week(s).   Specialty: Nurse Practitioner Contact information: [address] Laurell Josephs  [address] [place] [address] [phone]                Allergies  Allergen Reactions   Banana Nausea And Vomiting   Cucumber Extract Nausea And Vomiting   Egg-Derived Products Nausea And Vomiting    The results of significant diagnostics from this hospitalization (including imaging, microbiology, ancillary and laboratory) are listed below for reference.    Microbiology: No results found for this or any previous visit (from the past 240 hours).  Procedures/Studies: DG Abd Portable 1V Result Date: 03/14/2023 CLINICAL DATA:  Small bowel obstruction. EXAM: PORTABLE ABDOMEN - 1 VIEW COMPARISON:  Abdominal radiograph dated 03/13/2023. FINDINGS: Enteric tube with tip in the region of the distal stomach. Moderate stool throughout the colon. No bowel dilatation or evidence of obstruction. No free air or radiopaque calculi. Right upper quadrant cholecystectomy clips. The osseous structures are intact the soft tissues are unremarkable. IMPRESSION: Nonobstructive bowel gas pattern. Electronically Signed   By: Elgie Collard M.D.   On: 03/14/2023 12:00   DG Abd Portable 1V-Small Bowel Obstruction Protocol-initial, 8 hr delay Result Date: 03/13/2023  CLINICAL DATA:  Shortness of breath.  8 hour delayed films. EXAM: PORTABLE ABDOMEN - 1 VIEW COMPARISON:  03/13/2023 FINDINGS: An enteric tube is present with tip over the upper mid abdomen consistent with location in the distal stomach. The pelvis is not included within the field of view of the study. Contrast material is demonstrated throughout the visualized colon suggesting no evidence of complete small bowel obstruction. No colonic or small bowel dilatation. Lung bases are clear. Surgical clips in the right upper quadrant. IMPRESSION: Contrast material is demonstrated throughout the colon suggesting no evidence of complete small bowel obstruction. Electronically Signed   By: Burman Nieves M.D.   On: 03/13/2023 17:02   DG Abd Portable 1V-Small Bowel  Protocol-Position Verification Result Date: 03/13/2023 CLINICAL DATA:  48 year old female enteric tube placement. EXAM: PORTABLE ABDOMEN - 1 VIEW COMPARISON:  CT Abdomen and Pelvis 0111 hours today. FINDINGS: Portable AP view at 0538 hours. Enteric tube placed into the left upper quadrant with side hole at the level of the gastric body. Stomach seems less distended compared to the earlier CT. Lung bases appear negative. Stable cholecystectomy clips. IMPRESSION: Satisfactory enteric tube placement into the stomach. Electronically Signed   By: Odessa Fleming M.D.   On: 03/13/2023 05:58   CT ABDOMEN PELVIS W CONTRAST Result Date: 03/13/2023 CLINICAL DATA:  Pancreatitis, acute, severe. EXAM: CT ABDOMEN AND PELVIS WITH CONTRAST TECHNIQUE: Multidetector CT imaging of the abdomen and pelvis was performed using the standard protocol following bolus administration of intravenous contrast. RADIATION DOSE REDUCTION: This exam was performed according to the departmental dose-optimization program which includes automated exposure control, adjustment of the mA and/or kV according to patient size and/or use of iterative reconstruction technique. CONTRAST:  80mL OMNIPAQUE IOHEXOL 300 MG/ML  SOLN COMPARISON:  MRI abdomen 08/25/2022, CT abdomen pelvis with IV contrast 05/16/2022, CTA chest, abdomen and pelvis 11/18/2021. FINDINGS: Lower chest: The lower lobes demonstrate mosaicism which could be due to air trapping and small airway disease or a low inspiration. The lung bases are otherwise clear.  The cardiac size is normal. Hepatobiliary: Stable mild post cholecystectomy biliary prominence both intrahepatic and extrahepatic common bile duct is 10 mm as before. The liver is unremarkable. Pancreas: Unremarkable. No pancreatic ductal dilatation or surrounding inflammatory changes. Spleen: Normal. Adrenals/Urinary Tract: There is no adrenal mass. Cystic lesions in the superior pole of the right kidney again measure 1.1 cm and 1 cm. MRI  last year showed findings consistent with Bosniak 2 F cysts, and a proteinaceous/hemorrhagic 1.9 cm cyst anteriorly in the left lower pole. Follow-up MRI was recommended in 1 year therefore MRI is recommended in the latter part of August of this year. There is no interval change in size. There is no solid mass enhancement of either kidney. There is no urinary stone or obstruction. No bladder thickening is seen. Stomach/Bowel: There is mild gastric distention with fluid and food products. Mild dilatation of ileal small bowel in the mid to lower abdomen up to 2.7 cm. The transitional segment is noted in the anterior central lower abdomen on 2:70 and 71 where there is a sharp hairpin turn involving a small bowel loop. Etiology could be adhesions or enteritis as there is some wall prominence at the hairpin turn. Findings are consistent with low-grade small-bowel obstruction with normal caliber of the remaining small bowel. The appendix is normal. There is moderate retained stool. No colonic thickening or inflammatory change. Vascular/Lymphatic: Aortic atherosclerosis. No enlarged abdominal or pelvic lymph nodes. Reproductive: Status post hysterectomy.  No adnexal masses. Other: Small umbilical fat hernia. No incarcerated hernia. No free air, free hemorrhage or free fluid. Musculoskeletal: No acute or significant osseous findings. IMPRESSION: 1. Low-grade small-bowel obstruction with transitional segment in the anterior central lower abdomen where there is a sharp hairpin turn involving a small bowel loop. Etiology could be adhesions or enteritis. No adjacent mesenteric edema. 2. Constipation. 3. Stable mild post cholecystectomy biliary prominence. 4. 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 Follow-up MRI was recommended in 1 year in the prior report therefore MRI is recommended in the latter part of August of this  year. 5. Aortic atherosclerosis. 6. 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 Aortic Atherosclerosis (ICD10-I70.0). Electronically Signed   By: Almira Bar M.D.   On: 03/13/2023 02:26    Labs: BNP (last 3 results) No results for input(s): "BNP" in the last 8760 hours. Basic Metabolic Panel: Recent Labs  Lab 03/12/23 2221 03/13/23 0500 03/14/23 0529  NA 139 139 141  K 3.5 3.8 3.3*  CL 107 108 108  CO2 22 22 24   GLUCOSE 135* 117* 74  BUN 22* 19 22*  CREATININE 0.59 0.54 0.47  CALCIUM 8.2* 7.9* 8.0*  MG  --  1.9  --   PHOS  --  3.6  --    Liver Function Tests: Recent Labs  Lab 03/12/23 2221 03/13/23 0500 03/14/23 0529  AST 53* 667* 236*  ALT 29 423* 379*  ALKPHOS 47 72 76  BILITOT 1.1 1.1 1.0  PROT 6.8 6.8 5.9*  ALBUMIN 3.6 3.6 3.1*   Recent Labs  Lab 03/12/23 2221 03/13/23 0500  LIPASE 140* 93*   No results for input(s): "AMMONIA" in the last 168 hours. CBC: Recent Labs  Lab 03/12/23 2221 03/13/23 0500 03/14/23 0529  WBC 14.0* 7.8 6.2  HGB 14.0 13.4 11.6*  HCT 41.3 39.8 35.2*  MCV 89.4 90.9 92.4  PLT 318 303 221   No results for input(s): "VITAMINB12", "FOLATE", "FERRITIN", "TIBC", "IRON", "RETICCTPCT" in the last 72 hours. Urinalysis    Component Value Date/Time   COLORURINE AMBER (A) 03/12/2023 1014   APPEARANCEUR CLEAR 03/12/2023 1014   APPEARANCEUR Cloudy (A) 11/30/2021 1140   LABSPEC >1.046 (H) 03/12/2023 1014   PHURINE 5.0 03/12/2023 1014   GLUCOSEU NEGATIVE 03/12/2023 1014   HGBUR NEGATIVE 03/12/2023 1014   BILIRUBINUR NEGATIVE 03/12/2023 1014   BILIRUBINUR Negative 07/14/2022 1555   BILIRUBINUR Negative 11/30/2021 1140   KETONESUR NEGATIVE 03/12/2023 1014   PROTEINUR NEGATIVE 03/12/2023 1014   UROBILINOGEN 0.2 07/14/2022 1555   UROBILINOGEN 0.2 07/26/2006 0930   NITRITE NEGATIVE 03/12/2023 1014   LEUKOCYTESUR NEGATIVE 03/12/2023 1014   Sepsis Labs Recent Labs   Lab 03/12/23 2221 03/13/23 0500 03/14/23 0529  WBC 14.0* 7.8 6.2   Microbiology No results found for this or any previous visit (from the past 240 hours).  Time coordinating discharge: 25 minutes  SIGNED: Lanae Boast, MD  [facility] 03/14/2023, 3:25 PM  If 7PM-7AM, please contact night-coverage [email or web address]

## 2023-03-14 NOTE — Progress Notes (Signed)
   03/14/23 1204  TOC Brief Assessment  Insurance and Status Reviewed  Patient has primary care physician Yes  Home environment has been reviewed Home w/ spouse  Prior level of function: Independent  Prior/Current Home Services No current home services  Social Drivers of Health Review SDOH reviewed no interventions necessary  Readmission risk has been reviewed Yes  Transition of care needs no transition of care needs at this time

## 2023-03-14 NOTE — Plan of Care (Signed)
  Problem: Education: Goal: Knowledge of General Education information will improve Description: Including pain rating scale, medication(s)/side effects and non-pharmacologic comfort measures Outcome: Progressing   Problem: Clinical Measurements: Goal: Ability to maintain clinical measurements within normal limits will improve Outcome: Progressing Goal: Will remain free from infection Outcome: Progressing   Problem: Coping: Goal: Level of anxiety will decrease Outcome: Progressing   Problem: Pain Managment: Goal: General experience of comfort will improve and/or be controlled Outcome: Progressing   Problem: Safety: Goal: Ability to remain free from injury will improve Outcome: Progressing

## 2023-03-14 NOTE — Progress Notes (Signed)
 Progress Note     Subjective: Pt reports pain in throat from NGT. Denies significant abdominal pain. Had a loose BM and passing flatus. Reports her daughter started having some nausea and vomiting this AM as well. Denies nausea or vomiting with NGT clamped for 2 hrs this AM.   Objective: Vital signs in last 24 hours: Temp:  [98.2 F (36.8 C)-99 F (37.2 C)] 98.3 F (36.8 C) (02/25 0616) Pulse Rate:  [65-84] 65 (02/25 0616) Resp:  [16-18] 18 (02/25 0616) BP: (94-116)/(64-81) 111/64 (02/25 0616) SpO2:  [95 %-98 %] 98 % (02/25 0616) Last BM Date : 03/12/23  Intake/Output from previous day: 02/24 0701 - 02/25 0700 In: 1125.5 [I.V.:1125.5] Out: 600 [Emesis/NG output:600] Intake/Output this shift: No intake/output data recorded.  PE: General: pleasant, WD, WN female who is laying in bed in NAD HEENT: sclera anicteric Lungs: Respiratory effort nonlabored Abd: soft, NT, ND, NGT clamped (removed by myself at bedside) Psych: A&Ox3 with an appropriate affect.    Lab Results:  Recent Labs    03/13/23 0500 03/14/23 0529  WBC 7.8 6.2  HGB 13.4 11.6*  HCT 39.8 35.2*  PLT 303 221   BMET Recent Labs    03/13/23 0500 03/14/23 0529  NA 139 141  K 3.8 3.3*  CL 108 108  CO2 22 24  GLUCOSE 117* 74  BUN 19 22*  CREATININE 0.54 0.47  CALCIUM 7.9* 8.0*   PT/INR No results for input(s): "LABPROT", "INR" in the last 72 hours. CMP     Component Value Date/Time   NA 141 03/14/2023 0529   K 3.3 (L) 03/14/2023 0529   CL 108 03/14/2023 0529   CO2 24 03/14/2023 0529   GLUCOSE 74 03/14/2023 0529   BUN 22 (H) 03/14/2023 0529   CREATININE 0.47 03/14/2023 0529   CREATININE 0.54 03/14/2012 1528   CALCIUM 8.0 (L) 03/14/2023 0529   PROT 5.9 (L) 03/14/2023 0529   ALBUMIN 3.1 (L) 03/14/2023 0529   AST 236 (H) 03/14/2023 0529   ALT 379 (H) 03/14/2023 0529   ALKPHOS 76 03/14/2023 0529   BILITOT 1.0 03/14/2023 0529   GFRNONAA >60 03/14/2023 0529   GFRAA  07/18/2006 1433    >60         The eGFR has been calculated using the MDRD equation. This calculation has not been validated in all clinical   Lipase     Component Value Date/Time   LIPASE 93 (H) 03/13/2023 0500       Studies/Results: DG Abd Portable 1V-Small Bowel Obstruction Protocol-initial, 8 hr delay Result Date: 03/13/2023 CLINICAL DATA:  Shortness of breath.  8 hour delayed films. EXAM: PORTABLE ABDOMEN - 1 VIEW COMPARISON:  03/13/2023 FINDINGS: An enteric tube is present with tip over the upper mid abdomen consistent with location in the distal stomach. The pelvis is not included within the field of view of the study. Contrast material is demonstrated throughout the visualized colon suggesting no evidence of complete small bowel obstruction. No colonic or small bowel dilatation. Lung bases are clear. Surgical clips in the right upper quadrant. IMPRESSION: Contrast material is demonstrated throughout the colon suggesting no evidence of complete small bowel obstruction. Electronically Signed   By: Burman Nieves M.D.   On: 03/13/2023 17:02   DG Abd Portable 1V-Small Bowel Protocol-Position Verification Result Date: 03/13/2023 CLINICAL DATA:  48 year old female enteric tube placement. EXAM: PORTABLE ABDOMEN - 1 VIEW COMPARISON:  CT Abdomen and Pelvis 0111 hours today. FINDINGS: Portable AP view at 0538 hours.  Enteric tube placed into the left upper quadrant with side hole at the level of the gastric body. Stomach seems less distended compared to the earlier CT. Lung bases appear negative. Stable cholecystectomy clips. IMPRESSION: Satisfactory enteric tube placement into the stomach. Electronically Signed   By: Odessa Fleming M.D.   On: 03/13/2023 05:58   CT ABDOMEN PELVIS W CONTRAST Result Date: 03/13/2023 CLINICAL DATA:  Pancreatitis, acute, severe. EXAM: CT ABDOMEN AND PELVIS WITH CONTRAST TECHNIQUE: Multidetector CT imaging of the abdomen and pelvis was performed using the standard protocol following bolus  administration of intravenous contrast. RADIATION DOSE REDUCTION: This exam was performed according to the departmental dose-optimization program which includes automated exposure control, adjustment of the mA and/or kV according to patient size and/or use of iterative reconstruction technique. CONTRAST:  80mL OMNIPAQUE IOHEXOL 300 MG/ML  SOLN COMPARISON:  MRI abdomen 08/25/2022, CT abdomen pelvis with IV contrast 05/16/2022, CTA chest, abdomen and pelvis 11/18/2021. FINDINGS: Lower chest: The lower lobes demonstrate mosaicism which could be due to air trapping and small airway disease or a low inspiration. The lung bases are otherwise clear.  The cardiac size is normal. Hepatobiliary: Stable mild post cholecystectomy biliary prominence both intrahepatic and extrahepatic common bile duct is 10 mm as before. The liver is unremarkable. Pancreas: Unremarkable. No pancreatic ductal dilatation or surrounding inflammatory changes. Spleen: Normal. Adrenals/Urinary Tract: There is no adrenal mass. Cystic lesions in the superior pole of the right kidney again measure 1.1 cm and 1 cm. 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 Follow-up MRI was recommended in 1 year therefore MRI is recommended in the latter part of August of this year. There is no interval change in size. There is no solid mass enhancement of either kidney. There is no urinary stone or obstruction. No bladder thickening is seen. Stomach/Bowel: There is mild gastric distention with fluid and food products. Mild dilatation of ileal small bowel in the mid to lower abdomen up to 2.7 cm. The transitional segment is noted in the anterior central lower abdomen on 2:70 and 71 where there is a sharp hairpin turn involving a small bowel loop. Etiology could be adhesions or enteritis as there is some wall prominence at the hairpin turn. Findings are consistent with  low-grade small-bowel obstruction with normal caliber of the remaining small bowel. The appendix is normal. There is moderate retained stool. No colonic thickening or inflammatory change. Vascular/Lymphatic: Aortic atherosclerosis. No enlarged abdominal or pelvic lymph nodes. Reproductive: Status post hysterectomy. No adnexal masses. Other: Small umbilical fat hernia. No incarcerated hernia. No free air, free hemorrhage or free fluid. Musculoskeletal: No acute or significant osseous findings. IMPRESSION: 1. Low-grade small-bowel obstruction with transitional segment in the anterior central lower abdomen where there is a sharp hairpin turn involving a small bowel loop. Etiology could be adhesions or enteritis. No adjacent mesenteric edema. 2. Constipation. 3. Stable mild post cholecystectomy biliary prominence. 4. 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 Follow-up MRI was recommended in 1 year in the prior report therefore MRI is recommended in the latter part of August of this year. 5. Aortic atherosclerosis. 6. 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 Aortic Atherosclerosis (ICD10-I70.0). Electronically Signed   By: Earlean Shawl.D.  On: 03/13/2023 02:26    Anti-infectives: Anti-infectives (From admission, onward)    None        Assessment/Plan  SBO vs gastroenteritis  - CT 2/24 with low grade SBO with transitional segment in anterior central lower abdomen with sharp turn involving small bowel loop, etiology could be adhesions vs enteritis, constipation - SBO protocol initiated - contrast passed to colon on follow up film, having bowel function now - NGT removed, CLD - mobilize as tolerated  - ok to advance to soft diet later today if tolerating CLD this AM - no indication for acute surgical intervention at this  time Elevated LFTs - hep panel negative, downtrending this AM  FEN: CLD, IVF per [facility] VTE: LMWH ID: no current abx  - per [facility] -  HTN Pre-diabetes GERD Tobacco use disorder   LOS: 1 day   I reviewed hospitalist notes, last 24 h vitals and pain scores, last 48 h intake and output, last 24 h labs and trends, and last 24 h imaging results.  This care required moderate level of medical decision making.    Juliet Rude, [facility] 03/14/2023, 10:02 AM Please see Amion for pager number during day hours 7:00am-4:30pm

## 2023-03-16 ENCOUNTER — Ambulatory Visit: Payer: BC Managed Care – PPO | Admitting: Nurse Practitioner

## 2023-03-16 ENCOUNTER — Encounter: Payer: Self-pay | Admitting: Nurse Practitioner

## 2023-03-16 VITALS — BP 118/74 | HR 71 | Temp 98.1°F | Ht 68.0 in | Wt 184.0 lb

## 2023-03-16 DIAGNOSIS — I809 Phlebitis and thrombophlebitis of unspecified site: Secondary | ICD-10-CM | POA: Diagnosis not present

## 2023-03-16 DIAGNOSIS — K56609 Unspecified intestinal obstruction, unspecified as to partial versus complete obstruction: Secondary | ICD-10-CM

## 2023-03-16 DIAGNOSIS — I10 Essential (primary) hypertension: Secondary | ICD-10-CM

## 2023-03-16 MED ORDER — AMLODIPINE BESYLATE 5 MG PO TABS: 5.0000 mg | ORAL_TABLET | Freq: Every day | ORAL | 3 refills | Status: AC

## 2023-03-16 NOTE — Assessment & Plan Note (Signed)
 Blood pressure medication Norvasc 10mg  was restarted recently due to elevated readings after smoking cessation, with current blood pressure controlled. Reduce Norvasc to 5mg  daily and monitor blood pressure regularly. Advised to contact if readings remain consistently elevated over 130/90.

## 2023-03-16 NOTE — Assessment & Plan Note (Addendum)
 Redness, swelling and bruising at recent IV sites suggest superficial phlebitis. Monitor for worsening redness or spreading. Advised warm compresses. Return precautions given to patient.

## 2023-03-16 NOTE — Assessment & Plan Note (Signed)
 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 Mild abdominal discomfort and frequent loose stools persist. Schedule follow-up with GI to evaluate scar tissue and consider endoscopy or colonoscopy. Advise a bland diet with gradual increase as tolerated and encourage hydration. Continue zofran and tramadol as needed. Order repeat labs, including lipase, liver function, and blood counts, next week to monitor resolution.

## 2023-03-16 NOTE — Progress Notes (Signed)
 Bethanie Dicker, NP-C Phone: [phone]  [name] is a 48 y.o. female who presents today for hospital follow up.   Discussed the use of AI scribe software for clinical note transcription with the patient, who gave verbal consent to proceed.  History of Present Illness   [name] is a 48 year old female who presents for hospital follow-up after a recent bowel obstruction.  She experienced a recent hospitalization due to severe abdominal pain that began suddenly, causing her to collapse and necessitating an ambulance call. The pain initially subsided but recurred, prompting a visit to the hospital where she was diagnosed with a bowel obstruction after a CT scan. She received morphine, Dilaudid, and ketamine for pain management and had an NG tube placed. Post-hospitalization, she experiences frequent bowel movements described as 'urinating out of my butt,' causing discomfort and irritation. She also reports feeling 'puffy' and has noticed weight fluctuations, having lost weight prior to the hospitalization but gained some back recently. She is currently taking tramadol for residual abdominal and back pain, which she attributes to muscle contractions during the pain episodes. Ongoing mild abdominal pain, particularly with bowel movements, and back pain are noted. No nausea, as she has Zofran available for use.  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 She is concerned about the possibility of recurrence due to the scar tissue and is interested in further evaluation to prevent future episodes. She has previously undergone an endoscopy but not a complete colonoscopy due to inadequate preparation.  She has a history of hypertension, which she managed to control through weight loss and lifestyle changes, including quitting smoking in December. However, her blood pressure has recently increased,  necessitating the resumption of her medication, Norvasc, at a lower dose. She notes that her blood pressure was well-controlled during her hospital stay due to the medications administered there.  She reports a recent episode of phlebitis at the site of an IV insertion, with redness and bruising that is improving. She is concerned about the appearance of her hand and the potential for complications due to previous vein damage from past hospitalizations.  She quit smoking on December 12th and has been focusing on weight loss and healthy eating since August. She follows a structured diet plan that emphasizes high protein and low carbohydrates, which she finds beneficial for managing her weight.      Social History   Tobacco Use  Smoking Status Every Day   Current packs/day: 0.50   Average packs/day: 0.5 packs/day for 15.0 years (7.5 ttl pk-yrs)   Types: Cigarettes  Smokeless Tobacco Never  Tobacco Comments   Soon, but not yet    Current Outpatient Medications on File Prior to Visit  Medication Sig Dispense Refill   dicyclomine (BENTYL) 10 MG capsule Take 1 capsule (10 mg total) by mouth 3 (three) times daily before meals. (Patient not taking: Reported on 10/03/2022) 90 capsule 2   ibuprofen (ADVIL,MOTRIN) 200 MG tablet Take 600-800 mg by mouth every 6 (six) hours as needed for mild pain. For pain     Multiple Vitamin (MULTIVITAMIN) capsule Take 1 capsule by mouth daily.     nicotine (NICODERM CQ - DOSED IN MG/24 HOURS) 14 mg/24hr patch Place 14 mg onto the skin daily.     omeprazole (PRILOSEC) 40 MG capsule Take 1 capsule (40 mg total) by mouth daily before breakfast. (Patient taking differently: Take 40 mg by  mouth daily as needed (GERD).) 30 capsule 0   ondansetron (ZOFRAN-ODT) 4 MG disintegrating tablet Take 1 tablet (4 mg total) by mouth every 8 (eight) hours as needed for nausea or vomiting. 30 tablet 1   traMADol (ULTRAM) 50 MG tablet Take 1 tablet (50 mg total) by mouth every 12  (twelve) hours as needed for up to 10 doses. 10 tablet 0   [DISCONTINUED] metoCLOPramide (REGLAN) 10 MG tablet Take 1 tablet (10 mg total) by mouth every 6 (six) hours. (Patient not taking: Reported on 05/19/2020) 15 tablet 0   No current facility-administered medications on file prior to visit.     ROS see history of present illness  Objective  Physical Exam Vitals:   03/16/23 1105  BP: 118/74  Pulse: 71  Temp: 98.1 F (36.7 C)  SpO2: 97%    BP Readings from Last 3 Encounters:  03/16/23 118/74  03/14/23 105/68  10/03/22 (!) 126/90   Wt Readings from Last 3 Encounters:  03/16/23 184 lb (83.5 kg)  03/12/23 198 lb 6.6 oz (90 kg)  10/03/22 201 lb 0.3 oz (91.2 kg)    Physical Exam Constitutional:      General: She is not in acute distress.    Appearance: Normal appearance.  HENT:     Head: Normocephalic.  Cardiovascular:     Rate and Rhythm: Normal rate and regular rhythm.     Heart sounds: Normal heart sounds.  Pulmonary:     Effort: Pulmonary effort is normal.     Breath sounds: Normal breath sounds.  Abdominal:     General: Abdomen is flat. Bowel sounds are normal.     Palpations: Abdomen is soft.     Tenderness: There is no abdominal tenderness. There is no guarding.  Skin:    General: Skin is warm and dry.  Neurological:     General: No focal deficit present.     Mental Status: She is alert.  Psychiatric:        Mood and Affect: Mood normal.        Behavior: Behavior normal.    Assessment/Plan: Please see individual problem list.  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 Mild abdominal discomfort and frequent loose stools persist. Schedule follow-up with GI to evaluate scar tissue and consider endoscopy or colonoscopy. Advise a bland diet with gradual increase as tolerated and encourage  hydration. Continue zofran and tramadol as needed. Order repeat labs, including lipase, liver function, and blood counts, next week to monitor resolution.  Orders: -     CBC with Differential/Platelet; Future -     Comprehensive metabolic panel; Future -     Lipase; Future  Benign essential hypertension Assessment & Plan: Blood pressure medication Norvasc 10mg  was restarted recently due to elevated readings after smoking cessation, with current blood pressure controlled. Reduce Norvasc to 5mg  daily and monitor blood pressure regularly. Advised to contact if readings remain consistently elevated over 130/90.   Orders: -     amLODIPine Besylate; Take 1 tablet (5 mg total) by mouth daily.  Dispense: 90 tablet; Refill: 3  Superficial phlebitis Assessment & Plan: Redness, swelling and bruising at recent IV sites suggest superficial phlebitis. Monitor for worsening redness or spreading. Advised warm compresses. Return precautions given to patient.      Return in about 5 days (around 03/21/2023) for lab work then around 08/01/23  for annual exam, sooner if needed.   Bethanie Dicker, NP-C [facility]

## 2023-03-21 ENCOUNTER — Other Ambulatory Visit (INDEPENDENT_AMBULATORY_CARE_PROVIDER_SITE_OTHER): Payer: BC Managed Care – PPO

## 2023-03-21 DIAGNOSIS — K56609 Unspecified intestinal obstruction, unspecified as to partial versus complete obstruction: Secondary | ICD-10-CM | POA: Diagnosis not present

## 2023-03-21 LAB — COMPREHENSIVE METABOLIC PANEL
ALT: 50 U/L — ABNORMAL HIGH (ref 0–35)
AST: 16 U/L (ref 0–37)
Albumin: 4.4 g/dL (ref 3.5–5.2)
Alkaline Phosphatase: 57 U/L (ref 39–117)
BUN: 21 mg/dL (ref 6–23)
CO2: 29 meq/L (ref 19–32)
Calcium: 9.5 mg/dL (ref 8.4–10.5)
Chloride: 99 meq/L (ref 96–112)
Creatinine, Ser: 0.6 mg/dL (ref 0.40–1.20)
GFR: 106 mL/min (ref >60.00)
Glucose, Bld: 89 mg/dL (ref 70–99)
Potassium: 3.8 meq/L (ref 3.5–5.1)
Sodium: 136 meq/L (ref 135–145)
Total Bilirubin: 0.6 mg/dL (ref 0.2–1.2)
Total Protein: 7.8 g/dL (ref 6.0–8.3)

## 2023-03-21 LAB — CBC WITH DIFFERENTIAL/PLATELET
Basophils Absolute: 0.1 10*3/uL (ref 0.0–0.1)
Basophils Relative: 1.1 % (ref 0.0–3.0)
Eosinophils Absolute: 0.4 10*3/uL (ref 0.0–0.7)
Eosinophils Relative: 4.7 % (ref 0.0–5.0)
HCT: 43.4 % (ref 36.0–46.0)
Hemoglobin: 14.6 g/dL (ref 12.0–15.0)
Lymphocytes Relative: 32.6 % (ref 12.0–46.0)
Lymphs Abs: 3.1 10*3/uL (ref 0.7–4.0)
MCHC: 33.6 g/dL (ref 30.0–36.0)
MCV: 91.9 fl (ref 78.0–100.0)
Monocytes Absolute: 0.6 10*3/uL (ref 0.1–1.0)
Monocytes Relative: 6.2 % (ref 3.0–12.0)
Neutro Abs: 5.2 10*3/uL (ref 1.4–7.7)
Neutrophils Relative %: 55.4 % (ref 43.0–77.0)
Platelets: 397 10*3/uL (ref 150.0–400.0)
RBC: 4.72 Mil/uL (ref 3.87–5.11)
RDW: 12.8 % (ref 11.5–15.5)
WBC: 9.4 10*3/uL (ref 4.0–10.5)

## 2023-03-21 LAB — LIPASE: Lipase: 58 U/L (ref 11.0–59.0)

## 2023-07-17 ENCOUNTER — Ambulatory Visit: Payer: Self-pay

## 2023-07-17 NOTE — Telephone Encounter (Signed)
 FYI Only or Action Required?: FYI only for provider.  Patient was last seen in primary care on 03/16/2023 by Angel App, NP. Called Nurse Triage reporting Urinary Frequency. Symptoms began several days ago. Interventions attempted: Prescription medications: Zofran . Symptoms are: gradually worsening.  Triage Disposition: See Physician Within 24 Hours  Patient/caregiver understands and will follow disposition?: Yes  Copied from CRM [id number]. Topic: Clinical - Red Word Triage >> Jul 17, 2023  2:44 PM [place] wrote: Red Word that prompted transfer to Nurse Triage: c Reason for Disposition  Side (flank) or lower back pain present  Answer Assessment - Initial Assessment Questions Pt reports not being able to seek ED right now d/t personal finance reasons. Pt is going to an UC close to her new home. Nurse asks if she can assist with locating the UC and pt reports she knows which UC to go to because she is familiar with the location.   1. SYMPTOM: What's the main symptom you're concerned about? (e.g., frequency, incontinence)     Urinary freq and not hurting. Having some trouble urinating   2. ONSET: When did the  s/s  start?     2 days 3. PAIN: Is there any pain? If Yes, ask: How bad is it? (Scale: 1-10; mild, moderate, severe)     0 4. CAUSE: What do you think is causing the symptoms?     Hx of UTI 5. OTHER SYMPTOMS: Do you have any other symptoms? (e.g., blood in urine, fever, flank pain, pain with urination)     Low back pain by kidneys aches and does not radiate,  chills, does not know if having fevers, nausea, no vomit yet.  Zofran  pt has used has helps a little.   6. PREGNANCY: Is there any chance you are pregnant? When was your last menstrual period?     No, post hysterectomy.  Protocols used: Urinary Symptoms-A-AH

## 2023-07-18 NOTE — Telephone Encounter (Signed)
 Called pt and offered her an appointment at [facility] and she stated that she can not make the appointment time. She said if she can get the money up that she will go to UC if not she will call back to see if we have any opening. Patient also stated that the pain is not as bad as yesterday.

## 2023-07-20 ENCOUNTER — Ambulatory Visit

## 2023-07-20 VITALS — BP 140/80 | HR 67 | Temp 98.8°F | Ht 68.0 in | Wt 172.0 lb

## 2023-07-20 DIAGNOSIS — I10 Essential (primary) hypertension: Secondary | ICD-10-CM

## 2023-07-20 DIAGNOSIS — Z720 Tobacco use: Secondary | ICD-10-CM

## 2023-07-20 DIAGNOSIS — M62838 Other muscle spasm: Secondary | ICD-10-CM | POA: Insufficient documentation

## 2023-07-20 DIAGNOSIS — G8929 Other chronic pain: Secondary | ICD-10-CM | POA: Insufficient documentation

## 2023-07-20 DIAGNOSIS — M25512 Pain in left shoulder: Secondary | ICD-10-CM

## 2023-07-20 MED ORDER — CYCLOBENZAPRINE HCL 5 MG PO TABS: 5.0000 mg | ORAL_TABLET | Freq: Two times a day (BID) | ORAL | 0 refills | Status: AC | PRN

## 2023-07-20 MED ORDER — DICLOFENAC SODIUM 75 MG PO TBEC: 75.0000 mg | DELAYED_RELEASE_TABLET | Freq: Two times a day (BID) | ORAL | 0 refills | Status: AC

## 2023-07-20 NOTE — Patient Instructions (Addendum)
-   Rest, avoid pain triggering activities (including laying on left side)ice/head, antiinflammatory medication called Diclofenac 75 mg take it twice a day for next 4 weeks, as needed muscle relaxant (5 mg Flexeril twice a day) and a structured physical therapy exercise. I have sent prescription, physical therapy referral as well. We discussed about getting x-ray today, you want to hold off on x-ray at this time. I also put a ortho referral. If you do not hear from scheduling department in 2 weeks for PT and ortho appointment please reach out to our office.   - Check home BP 3 times a week and bring home BP reading to review with your PCP in about 4 weeks. Please start taking your BP medication everyday.    - Please do not take other NSAIDs with Diclofenac as it can cause serious side effects.

## 2023-07-20 NOTE — Progress Notes (Addendum)
 Acute Office Visit  Subjective:    Patient ID: [name], female    DOB: 11/17/1975, 47 y.o.   MRN: [id number]  Chief Complaint  Patient presents with   Shoulder Pain   Patient is in today for following acute concern: - Left shoulder, lateral neck pain:  2-3 months history of lateral left neck, left shoulder, arm pain.  Pain radiating down to left arm for about 4 weeks associated with weakness down index and middle finger.  Patient reports first source of pain was left lateral neck, upper shoulder. NO obvious trauma prior to pain onset.  Pain is constant in nature, worse in the morning, eases as the day goes by. When pain is at worse she rates it at 7/10 in intensity. No recent fever, chills.  Aggravated by: Laying on left side, extending, overhead shoulder movement.  She has been taking Ibuprofen 200 mg (4 tablets daily). Tylenol  4 cap/tab prn. Has tried alternating them every 4 hourly to help with pain with minimal symptoms with mild improvement in symptoms.  She is right hand dominant.  Has a h/o gestational diabetes.    Hypertension: Is prescribed Amlodipine  5 mg which she is taking periodically only when she feels like BP is elevated. Not regularly checking BP at home.     ROS As per HPI    Objective:    BP (!) 140/80 (BP Location: Right Arm, Cuff Size: Normal)   Pulse 67   Temp 98.8 F (37.1 C) (Oral)   Ht 5' 8 (1.727 m)   Wt 172 lb (78 kg)   SpO2 99%   BMI 26.15 kg/m    Physical Exam Constitutional:      General: She is not in acute distress. HENT:     Head: Normocephalic and atraumatic.  Neck:     Thyroid: No thyroid tenderness.     Comments: Pain on left lateral neck with neck extension, and right lateral flexion. No dizziness, nystagmus elicited with neck movement.  Cardiovascular:     Rate and Rhythm: Normal rate.     Pulses: Normal pulses.  Pulmonary:     Effort: Pulmonary effort is normal.     Breath sounds: Normal breath sounds.   Musculoskeletal:     Right shoulder: No tenderness. Normal pulse.     Left shoulder: No swelling, deformity, effusion, laceration or crepitus. Decreased range of motion. Decreased strength. Normal pulse.     Right upper arm: No bony tenderness.     Left upper arm: No bony tenderness.     Right forearm: No lacerations.     Left forearm: No lacerations.     Right hand: Normal range of motion. Normal pulse.     Left hand: Normal range of motion. Normal pulse.     Cervical back: No rigidity. Muscular tenderness (Pain on palpation over left upper trapezius) present. Normal range of motion.     Right lower leg: No edema.     Left lower leg: No edema.     Comments: Left: Positive Neer's and Painful arc test. Left shoulder weakness on empty can test noted.  Pain +spasm on palpation over left trapezius muscle.  Lymphadenopathy:     Cervical: No cervical adenopathy.  Neurological:     Mental Status: She is alert and oriented to person, place, and time.     Cranial Nerves: Cranial nerves 2-12 are intact.     No results found for any visits on 07/20/23.     Assessment &  Plan:  Chronic left shoulder pain Assessment & Plan: D/D includes rotator cuff tendonitis, sprain, partial tear, trapezius muscle spasm, OA.  Recommend:  Rest, avoid triggering activities, ice/head diclofenac 75 mg bid for 4 weeks with food (avoid other otc NSAIDs, s/e of NSAIDs discussed, specially in the context of h/o GERD)  prn BID 5 mg Flexeril (s/e including drowsiness discussed)   structured physical therapy (urgent PT referral made) Ortho evaluation ( referral made)  Discussed getting x-ray, patient declined. Also counseled patient that x-ray won't be able to show soft tissue structures like tendons, so MRI may be needed if continues to have pain despite above measures.   F/u in 4-6 weeks with PCP for reevaluation or sooner if symptoms worsens.      Orders: -     Ambulatory referral to Orthopedic Surgery -      Diclofenac Sodium; Take 1 tablet (75 mg total) by mouth 2 (two) times daily.  Dispense: 60 tablet; Refill: 0 -     Cyclobenzaprine HCl; Take 1 tablet (5 mg total) by mouth 2 (two) times daily as needed for muscle spasms.  Dispense: 60 tablet; Refill: 0 -     Ambulatory referral to Physical Therapy  Trapezius muscle spasm Assessment & Plan: Plan per left shoulder pain from today's visit.   Orders: -     Diclofenac Sodium; Take 1 tablet (75 mg total) by mouth 2 (two) times daily.  Dispense: 60 tablet; Refill: 0 -     Cyclobenzaprine HCl; Take 1 tablet (5 mg total) by mouth 2 (two) times daily as needed for muscle spasms.  Dispense: 60 tablet; Refill: 0 -     Ambulatory referral to Physical Therapy  Benign essential hypertension Assessment & Plan: BP uncontrolled. Not taking Amlodipine  5 mg as recommended. Patient counseled on medication compliance, risks of uncontrolled BP. She will start taking Amlodipine  5 mg daily. Check home BP 3 times a week and bring home BP reading to review with your PCP in about 4 weeks.    Tobacco abuse Assessment & Plan: Patient is going through preparation phase of smoking cessation. Encouraged her on smoking cessation.    I spent 40 minutes on the day of this face-to-face encounter reviewing the patient's medical history, medications, ongoing concerns, and reviewing the assessment and plan in detail with the patient.   Return in about 4 weeks (around 08/17/2023) for WIth PCP for f/u on left shoulder pain and BP (4-6 weeks).  Luke Shade, MD

## 2023-07-20 NOTE — Assessment & Plan Note (Addendum)
 BP uncontrolled. Not taking Amlodipine  5 mg as recommended. Patient counseled on medication compliance, risks of uncontrolled BP. She will start taking Amlodipine  5 mg daily. Check home BP 3 times a week and bring home BP reading to review with your PCP in about 4 weeks.

## 2023-07-20 NOTE — Addendum Note (Signed)
 Addended by: Tasharra Nodine on: 07/20/2023 11:18 AM   Modules accepted: Orders

## 2023-07-20 NOTE — Assessment & Plan Note (Signed)
 Plan per left shoulder pain from today's visit.

## 2023-07-20 NOTE — Assessment & Plan Note (Addendum)
 D/D includes rotator cuff tendonitis, sprain, partial tear, trapezius muscle spasm, OA.  Recommend:  Rest, avoid triggering activities, ice/head diclofenac 75 mg bid for 4 weeks with food (avoid other otc NSAIDs, s/e of NSAIDs discussed, specially in the context of h/o GERD)  prn BID 5 mg Flexeril (s/e including drowsiness discussed)   structured physical therapy (urgent PT referral made) Ortho evaluation ( referral made)  Discussed getting x-ray, patient declined. Also counseled patient that x-ray won't be able to show soft tissue structures like tendons, so MRI may be needed if continues to have pain despite above measures.   F/u in 4-6 weeks with PCP for reevaluation or sooner if symptoms worsens.

## 2023-07-20 NOTE — Assessment & Plan Note (Signed)
 Patient is going through preparation phase of smoking cessation. Encouraged her on smoking cessation.

## 2023-07-25 ENCOUNTER — Encounter (HOSPITAL_BASED_OUTPATIENT_CLINIC_OR_DEPARTMENT_OTHER): Payer: Self-pay | Admitting: Emergency Medicine

## 2023-07-25 ENCOUNTER — Ambulatory Visit (HOSPITAL_BASED_OUTPATIENT_CLINIC_OR_DEPARTMENT_OTHER): Admission: EM | Admit: 2023-07-25 | Source: Home / Self Care

## 2023-07-25 NOTE — ED Triage Notes (Signed)
 Pt was playing a game and she fell on her tail bone it happened in April but the pain when she sits to stand it makes her nauseated. Pt recently was taken diclofenac  and flexeril  for a torn rotator cuff but even those medications are not helping her with the pain.

## 2023-07-26 ENCOUNTER — Ambulatory Visit (HOSPITAL_BASED_OUTPATIENT_CLINIC_OR_DEPARTMENT_OTHER)
Admit: 2023-07-26 | Discharge: 2023-07-26 | Disposition: A | Discharge status: Home / Self Care | Attending: Family Medicine | Admitting: Radiology

## 2023-07-26 ENCOUNTER — Encounter (HOSPITAL_BASED_OUTPATIENT_CLINIC_OR_DEPARTMENT_OTHER): Payer: Self-pay

## 2023-07-26 ENCOUNTER — Ambulatory Visit (HOSPITAL_BASED_OUTPATIENT_CLINIC_OR_DEPARTMENT_OTHER)
Admission: RE | Admit: 2023-07-26 | Discharge: 2023-07-26 | Disposition: A | Discharge status: Home / Self Care | Source: Ambulatory Visit | Attending: Family Medicine | Admitting: Family Medicine

## 2023-07-26 ENCOUNTER — Other Ambulatory Visit (HOSPITAL_BASED_OUTPATIENT_CLINIC_OR_DEPARTMENT_OTHER): Payer: Self-pay

## 2023-07-26 VITALS — BP 120/84 | HR 91 | Temp 98.4°F | Resp 20

## 2023-07-26 DIAGNOSIS — R300 Dysuria: Secondary | ICD-10-CM | POA: Diagnosis present

## 2023-07-26 DIAGNOSIS — M533 Sacrococcygeal disorders, not elsewhere classified: Secondary | ICD-10-CM

## 2023-07-26 DIAGNOSIS — N3001 Acute cystitis with hematuria: Secondary | ICD-10-CM | POA: Insufficient documentation

## 2023-07-26 LAB — POCT URINALYSIS DIP (MANUAL ENTRY)
Bilirubin, UA: NEGATIVE
Glucose, UA: NEGATIVE mg/dL
Nitrite, UA: NEGATIVE
Spec Grav, UA: 1.02 (ref 1.010–1.025)
Urobilinogen, UA: 1 U/dL
pH, UA: 7.5 (ref 5.0–8.0)

## 2023-07-26 MED ORDER — NITROFURANTOIN MONOHYD MACRO 100 MG PO CAPS
100.0000 mg | ORAL_CAPSULE | Freq: Two times a day (BID) | ORAL | 0 refills | Status: AC
  Filled 2023-07-26: qty 10, 5d supply, fill #0

## 2023-07-26 NOTE — ED Triage Notes (Addendum)
 Pt c/o tail bone pain. She was playing a game when she fell on her tail bone at the end of April.  Pt thinks the pain is worst now than it was after the injury and is concerned it is not healing correctly. Pt has taken diclofenac , flexeril , tylenol , and ibuprofen when she is not taking the diclofenac .  She also complains of urinary hesitancy and is concerned she may have a uti as well. Symptoms started a week ago and she is having lower back pain that is different than the tail bone pain.

## 2023-07-26 NOTE — Discharge Instructions (Addendum)
 You do have a fracture. The radiologist recommended a CT scan or MRI for better confirmation. I would see ortho for this. This may take months to heal.  You are also constipated. Maybe try doing some miralax  for this. Make sure that you are drinking plenty of fluids. The pressure of stool may make the pain worse.  I am also treating you for a UTI. Take the antibiotics as prescribed.

## 2023-07-26 NOTE — ED Provider Notes (Signed)
 [facility]    CSN: [id number] Arrival date & time: 07/26/23  0813      History   Chief Complaint Chief Complaint  Patient presents with   Tailbone Pain   Urinary Frequency    HPI [name] is a 48 y.o. female.   Patient is a 48 year old female who presents today with multiple complaints.  Pt c/o tail bone pain. She was playing a game when she fell on her tail bone at the end of April.  Pt thinks the pain is worst now than it was after the injury and is concerned it is not healing correctly. Pt has taken diclofenac , flexeril , tylenol , and ibuprofen when she is not taking the diclofenac . No noticeable swelling   She also complains of urinary hesitancy and is concerned she may have a uti as well. Symptoms started a week ago and she is having lower back pain that is different than the tail bone pain. NO fever, flank pain    Urinary Frequency    Past Medical History:  Diagnosis Date   Allergy Couple years ago   GERD (gastroesophageal reflux disease) 1 year   Hypertension About a year   Pre-diabetes    Tobacco use disorder     Patient Active Problem List   Diagnosis Date Noted   Chronic left shoulder pain 07/20/2023   Trapezius muscle spasm 07/20/2023   Superficial phlebitis 03/16/2023   SBO (small bowel obstruction) (HCC) 03/13/2023   Abdominal pain, epigastric 10/03/2022   Colon cancer screening 10/03/2022   Renal mass 09/26/2022   Obesity (BMI 30-39.9) 08/01/2022   Benign essential hypertension 08/01/2022   Cervical lymphadenopathy 07/26/2022   Esophagitis 12/02/2021   Sigmoid thickening 12/02/2021   Elevated blood pressure reading 12/02/2021   Preventative health care 12/02/2021   Dysuria 12/02/2021   Transaminitis 12/01/2021   Oral herpes 12/01/2021   Tobacco abuse 11/18/2021   Abnormal serum level of lipase 11/18/2021    Past Surgical History:  Procedure Laterality Date   ABDOMINAL HYSTERECTOMY     BIOPSY  10/03/2022   Procedure:  BIOPSY;  Surgeon: Unk Corinn Skiff, MD;  Location: [facility];  Service: Gastroenterology;;   CESAREAN SECTION     CHOLECYSTECTOMY     COLONOSCOPY WITH PROPOFOL  N/A 10/03/2022   Procedure: COLONOSCOPY WITH PROPOFOL ;  Surgeon: Unk Corinn Skiff, MD;  Location: [facility];  Service: Gastroenterology;  Laterality: N/A;   ESOPHAGOGASTRODUODENOSCOPY (EGD) WITH PROPOFOL  N/A 10/03/2022   Procedure: ESOPHAGOGASTRODUODENOSCOPY (EGD) WITH PROPOFOL ;  Surgeon: Unk Corinn Skiff, MD;  Location: [facility];  Service: Gastroenterology;  Laterality: N/A;   MANDIBLE FRACTURE SURGERY      OB History     Gravida  4   Para  3   Term  3   Preterm  0   AB  1   Living  3      SAB  1   IAB  0   Ectopic  0   Multiple  0   Live Births               Home Medications    Prior to Admission medications   Medication Sig Start Date End Date Taking? Authorizing Provider  nitrofurantoin , macrocrystal-monohydrate, (MACROBID ) 100 MG capsule Take 1 capsule (100 mg total) by mouth 2 (two) times daily. 07/26/23  Yes Kohlton Gilpatrick A, FNP  amLODipine  (NORVASC ) 5 MG tablet Take 1 tablet (5 mg total) by mouth daily. 03/16/23   Lester, Kacy, NP  cyclobenzaprine  (FLEXERIL ) 5 MG  tablet Take 1 tablet (5 mg total) by mouth 2 (two) times daily as needed for muscle spasms. 07/20/23   Bair, Kalpana, MD  diclofenac  (VOLTAREN ) 75 MG EC tablet Take 1 tablet (75 mg total) by mouth 2 (two) times daily. 07/20/23   Bair, Kalpana, MD  famotidine  (PEPCID ) 20 MG tablet Take 20 mg by mouth daily.    [provider]  Multiple Vitamin (MULTIVITAMIN) capsule Take 1 capsule by mouth daily.    [provider]  nicotine (NICODERM CQ - DOSED IN MG/24 HOURS) 14 mg/24hr patch Place 14 mg onto the skin daily.    [provider]  omeprazole  (PRILOSEC ) 40 MG capsule Take 1 capsule (40 mg total) by mouth daily before breakfast. Patient taking differently: Take 40 mg by mouth daily as needed (GERD).  10/05/22   Unk Corinn Skiff, MD    Family History Family History  Problem Relation Age of Onset   Hypertension Mother    Cancer Mother    Hypertension Maternal Grandmother    Cancer Maternal Grandmother    Cancer Maternal Grandfather    Hypertension Paternal Grandmother    Diabetes Paternal Grandmother     Social History Social History   Tobacco Use   Smoking status: Every Day    Current packs/day: 0.50    Average packs/day: 0.5 packs/day for 15.0 years (7.5 ttl pk-yrs)    Types: Cigarettes   Smokeless tobacco: Never   Tobacco comments:    Soon, but not yet  Vaping Use   Vaping status: Never Used  Substance Use Topics   Alcohol use: Yes    Comment: Every once in a blue moon   Drug use: No     Allergies   Banana, Cucumber extract, and Egg-derived products   Review of Systems Review of Systems  Genitourinary:  Positive for frequency.    See HPI Physical Exam Triage Vital Signs ED Triage Vitals  Encounter Vitals Group     BP 07/26/23 0830 120/84     Girls Systolic BP Percentile --      Girls Diastolic BP Percentile --      Boys Systolic BP Percentile --      Boys Diastolic BP Percentile --      Pulse Rate 07/26/23 0830 91     Resp 07/26/23 0830 20     Temp 07/26/23 0830 98.4 F (36.9 C)     Temp Source 07/26/23 0830 Oral     SpO2 07/26/23 0830 96 %     Weight --      Height --      Head Circumference --      Peak Flow --      Pain Score 07/26/23 0827 2     Pain Loc --      Pain Education --      Exclude from Growth Chart --    No data found.  Updated Vital Signs BP 120/84 (BP Location: Right Arm)   Pulse 91   Temp 98.4 F (36.9 C) (Oral)   Resp 20   SpO2 96%   Visual Acuity Right Eye Distance:   Left Eye Distance:   Bilateral Distance:    Right Eye Near:   Left Eye Near:    Bilateral Near:     Physical Exam Vitals and nursing note reviewed.  Constitutional:      General: She is not in acute distress.    Appearance: Normal  appearance. She is not ill-appearing, toxic-appearing or diaphoretic.  Pulmonary:  Effort: Pulmonary effort is normal.  Musculoskeletal:       Legs:  Neurological:     Mental Status: She is alert.  Psychiatric:        Mood and Affect: Mood normal.      UC Treatments / Results  Labs (all labs ordered are listed, but only abnormal results are displayed) Labs Reviewed  POCT URINALYSIS DIP (MANUAL ENTRY) - Abnormal; Notable for the following components:      Result Value   Clarity, UA cloudy (*)    Ketones, POC UA trace (5) (*)    Blood, UA trace-intact (*)    Protein Ur, POC trace (*)    Leukocytes, UA Small (1+) (*)    All other components within normal limits  URINE CULTURE    EKG   Radiology DG Sacrum/Coccyx Result Date: 07/26/2023 CLINICAL DATA:  Fall onto sacrococcygeal region, continued pain. EXAM: SACRUM AND COCCYX - 2+ VIEW COMPARISON:  CT scan 03/13/2023 FINDINGS: Prominent stool throughout the colon favors constipation. No findings of SI joint diastasis. Suspected nondisplaced coccygeal fracture only appreciable on the lateral projection. This could be confirmed with CT or MRI. IMPRESSION: 1. Suspected nondisplaced coccygeal fracture only appreciable on the lateral projection. This could be confirmed with CT or MRI. 2. Prominent stool throughout the colon favors constipation. Electronically Signed   By: Ryan Salvage M.D.   On: 07/26/2023 09:34    Procedures Procedures (including critical care time)  Medications Ordered in UC Medications - No data to display  Initial Impression / Assessment and Plan / UC Course  I have reviewed the triage vital signs and the nursing notes.  Pertinent labs & imaging results that were available during my care of the patient were reviewed by me and considered in my medical decision making (see chart for details).     Coccyx pain-patient with positive fracture on x-ray.  Radiologist recommends CT or MRI for better  confirmation.  She has a follow-up appointment with orthopedics in a few weeks.  Recommend that she mentions this to them.  Otherwise she can keep taking anti-inflammatories.  She is also constipated and believe this is causing worsening pain and pressure in that area.  Recommend doing some MiraLAX .  She has been suffering from constipation and GI issues since having her gallbladder removed.  They are trying to figure out what is the cause of this.  Acute cystitis-urine with small leuks, trace blood, cloudy and trace protein.  Sending for culture.  Will go ahead and treat with this time.  Recommend push fluids. Follow-up as needed Final Clinical Impressions(s) / UC Diagnoses   Final diagnoses:  Dysuria  Coccyx pain  Acute cystitis with hematuria     Discharge Instructions      You do have a fracture. The radiologist recommended a CT scan or MRI for better confirmation. I would see ortho for this. This may take months to heal.  You are also constipated. Maybe try doing some miralax  for this. Make sure that you are drinking plenty of fluids. The pressure of stool may make the pain worse.  I am also treating you for a UTI. Take the antibiotics as prescribed.     ED Prescriptions     Medication Sig Dispense Auth. Provider   nitrofurantoin , macrocrystal-monohydrate, (MACROBID ) 100 MG capsule Take 1 capsule (100 mg total) by mouth 2 (two) times daily. 10 capsule Adah Wilbert LABOR, FNP      PDMP not reviewed this encounter.   Adah Wilbert  A, FNP 07/26/23 1004

## 2023-07-27 LAB — URINE CULTURE: Culture: <10000 — AB

## 2023-07-28 ENCOUNTER — Ambulatory Visit (HOSPITAL_COMMUNITY): Payer: Self-pay

## 2023-08-25 ENCOUNTER — Telehealth: Payer: Self-pay

## 2023-08-25 NOTE — Telephone Encounter (Signed)
 Copied from CRM #[id number]. Topic: Appointments - Transfer of Care >> Aug 25, 2023  4:07 PM Wess RAMAN wrote: Pt is requesting to transfer FROM: Gretel App, NP Pt is requesting to transfer TO: Dr. Dottie Rush Reason for requested transfer: closer to home It is the responsibility of the team the patient would like to transfer to (Dr. Dottie, Rush) to reach out to the patient if for any reason this transfer is not acceptable.

## 2023-09-04 ENCOUNTER — Encounter (HOSPITAL_BASED_OUTPATIENT_CLINIC_OR_DEPARTMENT_OTHER): Admitting: Family Medicine

## 2023-09-06 ENCOUNTER — Ambulatory Visit (HOSPITAL_BASED_OUTPATIENT_CLINIC_OR_DEPARTMENT_OTHER): Admitting: Family Medicine

## 2023-09-06 ENCOUNTER — Encounter (HOSPITAL_BASED_OUTPATIENT_CLINIC_OR_DEPARTMENT_OTHER): Payer: Self-pay | Admitting: Family Medicine

## 2023-09-06 VITALS — BP 118/82 | HR 80 | Temp 98.1°F | Resp 16 | Ht 68.11 in | Wt 167.9 lb

## 2023-09-06 DIAGNOSIS — R41840 Attention and concentration deficit: Secondary | ICD-10-CM

## 2023-09-06 DIAGNOSIS — G5603 Carpal tunnel syndrome, bilateral upper limbs: Secondary | ICD-10-CM | POA: Insufficient documentation

## 2023-09-06 DIAGNOSIS — F988 Other specified behavioral and emotional disorders with onset usually occurring in childhood and adolescence: Secondary | ICD-10-CM | POA: Insufficient documentation

## 2023-09-06 DIAGNOSIS — Z720 Tobacco use: Secondary | ICD-10-CM

## 2023-09-06 DIAGNOSIS — I10 Essential (primary) hypertension: Secondary | ICD-10-CM

## 2023-09-06 DIAGNOSIS — F4320 Adjustment disorder, unspecified: Secondary | ICD-10-CM | POA: Insufficient documentation

## 2023-09-06 MED ORDER — AMPHETAMINE-DEXTROAMPHET ER 20 MG PO CP24: 20.0000 mg | ORAL_CAPSULE | ORAL | 0 refills | Status: DC

## 2023-09-06 NOTE — Assessment & Plan Note (Signed)
 Controlled.  Continue current medication.  DASH diet.  Continue to review old records.

## 2023-09-06 NOTE — Assessment & Plan Note (Signed)
 Limited response to wrist splints, though she has struggled to wear them when she types.  She declines referral to Ortho for now.

## 2023-09-06 NOTE — Assessment & Plan Note (Signed)
 Encouraged smoking cessation with brief discussion today.

## 2023-09-06 NOTE — Assessment & Plan Note (Signed)
 She declines medication for anxiety/depression for now.

## 2023-09-06 NOTE — Progress Notes (Signed)
 Established Patient Office Visit  Subjective   Patient ID: [name], female    DOB: 1975/07/15  Age: 48 y.o. MRN: [id number]  Chief Complaint  Patient presents with   Establish Care    Establish care     F/u as above.  New to my practice.  She is going through a divorce and naturally struggling some.  Her ex is reportedly involved in drugs, and I advised her to get involved with [employer].  She is planning to pursue counseling soon for her anxiety and reported ADD.  She requests that I refill a stimulant that has worked well in years past.    Past Medical History:  Diagnosis Date   GERD (gastroesophageal reflux disease) 1 year   Glucose intolerance    History of small bowel obstruction    Hypertension About a year   Renal cyst    F/u MRI advised 2/26   Tobacco use disorder     Outpatient Encounter Medications as of 09/06/2023  Medication Sig   amLODipine  (NORVASC ) 5 MG tablet Take 1 tablet (5 mg total) by mouth daily.   amphetamine -dextroamphetamine (ADDERALL XR) 20 MG 24 hr capsule Take 1 capsule (20 mg total) by mouth every morning.   famotidine  (PEPCID ) 20 MG tablet Take 20 mg by mouth daily.   Multiple Vitamin (MULTIVITAMIN) capsule Take 1 capsule by mouth daily.   omeprazole  (PRILOSEC ) 40 MG capsule Take 1 capsule (40 mg total) by mouth daily before breakfast.   cyclobenzaprine  (FLEXERIL ) 5 MG tablet Take 1 tablet (5 mg total) by mouth 2 (two) times daily as needed for muscle spasms. (Patient not taking: Reported on 09/06/2023)   diclofenac  (VOLTAREN ) 75 MG EC tablet Take 1 tablet (75 mg total) by mouth 2 (two) times daily. (Patient not taking: Reported on 09/06/2023)   nicotine (NICODERM CQ - DOSED IN MG/24 HOURS) 14 mg/24hr patch Place 14 mg onto the skin daily. (Patient not taking: Reported on 09/06/2023)   nitrofurantoin , macrocrystal-monohydrate, (MACROBID ) 100 MG capsule Take 1 capsule (100 mg total) by mouth 2 (two) times daily. (Patient not taking: Reported on  09/06/2023)   No facility-administered encounter medications on file as of 09/06/2023.    Social History   Tobacco Use   Smoking status: Every Day    Current packs/day: 0.50    Average packs/day: 0.5 packs/day for 15.0 years (7.5 ttl pk-yrs)    Types: Cigarettes   Smokeless tobacco: Never   Tobacco comments:    Soon, but not yet  Vaping Use   Vaping status: Never Used  Substance Use Topics   Alcohol use: Yes    Comment: Rare   Drug use: No      Review of Systems  Constitutional:  Positive for malaise/fatigue. Negative for weight loss.  Cardiovascular:  Negative for chest pain and palpitations.  Neurological:  Negative for dizziness.  Psychiatric/Behavioral:  Positive for depression. Negative for hallucinations, memory loss, substance abuse and suicidal ideas. The patient is nervous/anxious. The patient does not have insomnia.       Objective:     BP 118/82 (BP Location: Right Arm, Patient Position: Standing, Cuff Size: Normal)   Pulse 80   Temp 98.1 F (36.7 C) (Oral)   Resp 16   Ht 5' 8.11 (1.73 m)   Wt 167 lb 14.4 oz (76.2 kg)   SpO2 95%   BMI 25.45 kg/m    Physical Exam Constitutional:      General: She is not in acute distress.  Appearance: Normal appearance.  HENT:     Head: Normocephalic.  Neck:     Vascular: No carotid bruit.  Cardiovascular:     Rate and Rhythm: Normal rate and regular rhythm.     Pulses: Normal pulses.     Heart sounds: Normal heart sounds.  Pulmonary:     Effort: Pulmonary effort is normal.     Breath sounds: Normal breath sounds.  Abdominal:     General: Bowel sounds are normal.     Palpations: Abdomen is soft.  Musculoskeletal:     Cervical back: Neck supple. No tenderness.     Right lower leg: No edema.     Left lower leg: No edema.  Neurological:     Mental Status: She is alert.  Psychiatric:        Mood and Affect: Mood normal.        Behavior: Behavior normal.      No results found for any visits on  09/06/23.    The 10-year ASCVD risk score (Arnett DK, et al., 2019) is: 5.6%    Assessment & Plan:  Benign essential hypertension Assessment & Plan: Controlled.  Continue current medication.  DASH diet.  Continue to review old records.   Carpal tunnel syndrome on both sides Assessment & Plan: Limited response to wrist splints, though she has struggled to wear them when she types.  She declines referral to Ortho for now.   Tobacco abuse Assessment & Plan: Encouraged smoking cessation with brief discussion today.   Adjustment disorder, unspecified type Assessment & Plan: She declines medication for anxiety/depression for now.   Attention or concentration deficit Assessment & Plan: Probable.  Agreed to a stimulant for now, and I'm pleased to hear that she will pursue counseling.  She has lots of issues today and will follow her closely for now.  Orders: -     Amphetamine -Dextroamphet ER; Take 1 capsule (20 mg total) by mouth every morning.  Dispense: 30 capsule; Refill: 0    Return in about 4 weeks (around 10/04/2023) for chronic follow-up.    [name]

## 2023-09-06 NOTE — Assessment & Plan Note (Addendum)
 Probable.  Agreed to a stimulant for now, and I'm pleased to hear that she will pursue counseling.  She has lots of issues today and will follow her closely for now.

## 2023-09-26 ENCOUNTER — Encounter (HOSPITAL_BASED_OUTPATIENT_CLINIC_OR_DEPARTMENT_OTHER): Payer: Self-pay | Admitting: Family Medicine

## 2023-10-04 ENCOUNTER — Encounter (HOSPITAL_BASED_OUTPATIENT_CLINIC_OR_DEPARTMENT_OTHER): Payer: Self-pay | Admitting: Family Medicine

## 2023-10-04 ENCOUNTER — Ambulatory Visit (INDEPENDENT_AMBULATORY_CARE_PROVIDER_SITE_OTHER): Admitting: Family Medicine

## 2023-10-04 VITALS — BP 127/86 | HR 77 | Temp 98.1°F | Wt 166.2 lb

## 2023-10-04 DIAGNOSIS — Z720 Tobacco use: Secondary | ICD-10-CM

## 2023-10-04 DIAGNOSIS — L989 Disorder of the skin and subcutaneous tissue, unspecified: Secondary | ICD-10-CM | POA: Insufficient documentation

## 2023-10-04 DIAGNOSIS — G5600 Carpal tunnel syndrome, unspecified upper limb: Secondary | ICD-10-CM | POA: Insufficient documentation

## 2023-10-04 DIAGNOSIS — E559 Vitamin D deficiency, unspecified: Secondary | ICD-10-CM | POA: Insufficient documentation

## 2023-10-04 DIAGNOSIS — M25569 Pain in unspecified knee: Secondary | ICD-10-CM | POA: Insufficient documentation

## 2023-10-04 DIAGNOSIS — R41840 Attention and concentration deficit: Secondary | ICD-10-CM

## 2023-10-04 DIAGNOSIS — F411 Generalized anxiety disorder: Secondary | ICD-10-CM | POA: Insufficient documentation

## 2023-10-04 DIAGNOSIS — I10 Essential (primary) hypertension: Secondary | ICD-10-CM

## 2023-10-04 DIAGNOSIS — B001 Herpesviral vesicular dermatitis: Secondary | ICD-10-CM

## 2023-10-04 DIAGNOSIS — K649 Unspecified hemorrhoids: Secondary | ICD-10-CM | POA: Insufficient documentation

## 2023-10-04 DIAGNOSIS — R195 Other fecal abnormalities: Secondary | ICD-10-CM | POA: Insufficient documentation

## 2023-10-04 DIAGNOSIS — K625 Hemorrhage of anus and rectum: Secondary | ICD-10-CM | POA: Insufficient documentation

## 2023-10-04 DIAGNOSIS — R141 Gas pain: Secondary | ICD-10-CM | POA: Insufficient documentation

## 2023-10-04 MED ORDER — VALACYCLOVIR HCL 500 MG PO TABS: 500.0000 mg | ORAL_TABLET | Freq: Two times a day (BID) | ORAL | 5 refills | Status: AC

## 2023-10-04 MED ORDER — AMPHETAMINE-DEXTROAMPHET ER 30 MG PO CP24
30.0000 mg | ORAL_CAPSULE | ORAL | 0 refills | Status: DC
Start: 2023-10-04 — End: 2023-11-02

## 2023-10-04 NOTE — Assessment & Plan Note (Signed)
 Controlled.  DASH diet.

## 2023-10-04 NOTE — Assessment & Plan Note (Signed)
 Discussion as above.  Chantix declined.

## 2023-10-04 NOTE — Assessment & Plan Note (Signed)
 Advised a good skin exam annually either with me or her GYN.

## 2023-10-04 NOTE — Assessment & Plan Note (Signed)
 Dosage increased and alert us  if not content in the coming weeks.

## 2023-10-04 NOTE — Progress Notes (Signed)
 Established Patient Office Visit  Subjective   Patient ID: [name], female    DOB: 11-Jan-1976  Age: 48 y.o. MRN: [id number]  Chief Complaint  Patient presents with   Follow-up    Follow-up visit   Medication Refill    Need refill on Adderall     F/u as above.  Please see previous notes for details.  She does feel the Adderall has been helpful, but would like to consider a dosage increase.  Her home stressors have settled down.  She also requests assistance for recurring fever blisters.  I reminded her to take a daily MVI.  She feels she can stop smoking on her own and declines my assistance with this.  She states her Mammogram is UTD thru her GYN.  Medication Refill Pertinent negatives include no chest pain.    Past Medical History:  Diagnosis Date   GERD (gastroesophageal reflux disease) 1 year   History of small bowel obstruction    Hypertension About a year   Ovarian failure    f/by GYN   Renal cyst    F/u MRI advised 2/26   Tobacco use disorder     Outpatient Encounter Medications as of 10/04/2023  Medication Sig   amLODipine  (NORVASC ) 5 MG tablet Take 1 tablet (5 mg total) by mouth daily.   famotidine  (PEPCID ) 20 MG tablet Take 20 mg by mouth daily.   Multiple Vitamin (MULTIVITAMIN) capsule Take 1 capsule by mouth daily.   omeprazole  (PRILOSEC ) 40 MG capsule Take 1 capsule (40 mg total) by mouth daily before breakfast.   valACYclovir  (VALTREX ) 500 MG tablet Take 1 tablet (500 mg total) by mouth 2 (two) times daily.   [DISCONTINUED] amphetamine -dextroamphetamine (ADDERALL XR) 20 MG 24 hr capsule Take 1 capsule (20 mg total) by mouth every morning.   amphetamine -dextroamphetamine (ADDERALL XR) 30 MG 24 hr capsule Take 1 capsule (30 mg total) by mouth every morning.   cyclobenzaprine  (FLEXERIL ) 5 MG tablet Take 1 tablet (5 mg total) by mouth 2 (two) times daily as needed for muscle spasms. (Patient not taking: Reported on 09/06/2023)   diclofenac  (VOLTAREN ) 75  MG EC tablet Take 1 tablet (75 mg total) by mouth 2 (two) times daily. (Patient not taking: Reported on 09/06/2023)   nicotine (NICODERM CQ - DOSED IN MG/24 HOURS) 14 mg/24hr patch Place 14 mg onto the skin daily. (Patient not taking: Reported on 09/06/2023)   nitrofurantoin , macrocrystal-monohydrate, (MACROBID ) 100 MG capsule Take 1 capsule (100 mg total) by mouth 2 (two) times daily. (Patient not taking: Reported on 09/06/2023)   No facility-administered encounter medications on file as of 10/04/2023.    Social History   Tobacco Use   Smoking status: Every Day    Current packs/day: 0.50    Average packs/day: 0.5 packs/day for 15.0 years (7.5 ttl pk-yrs)    Types: Cigarettes   Smokeless tobacco: Never   Tobacco comments:    Soon, but not yet  Vaping Use   Vaping status: Never Used  Substance Use Topics   Alcohol use: Yes    Comment: Rare   Drug use: No      Review of Systems  Constitutional:  Negative for malaise/fatigue and weight loss.  Cardiovascular:  Negative for chest pain and palpitations.  Neurological:  Negative for dizziness.  Psychiatric/Behavioral:  Negative for depression, hallucinations, memory loss, substance abuse and suicidal ideas. The patient is not nervous/anxious and does not have insomnia.       Objective:  BP 127/86 (BP Location: Right Arm, Patient Position: Sitting, Cuff Size: Normal)   Pulse 77   Temp 98.1 F (36.7 C) (Oral)   Wt 166 lb 3.2 oz (75.4 kg)   SpO2 96%   BMI 25.19 kg/m    Physical Exam Constitutional:      General: She is not in acute distress.    Appearance: Normal appearance.  HENT:     Head: Normocephalic.  Neck:     Vascular: No carotid bruit.  Cardiovascular:     Rate and Rhythm: Normal rate and regular rhythm.     Pulses: Normal pulses.     Heart sounds: Normal heart sounds.  Pulmonary:     Effort: Pulmonary effort is normal.     Breath sounds: Normal breath sounds.  Abdominal:     General: Bowel sounds are  normal.     Palpations: Abdomen is soft.  Musculoskeletal:     Cervical back: Neck supple. No tenderness.     Right lower leg: No edema.     Left lower leg: No edema.  Neurological:     Mental Status: She is alert.      No results found for any visits on 10/04/23.    The 10-year ASCVD risk score (Arnett DK, et al., 2019) is: 6.5%    Assessment & Plan:  Benign essential hypertension Assessment & Plan: Controlled.  DASH diet.   Fever blister -     valACYclovir  HCl; Take 1 tablet (500 mg total) by mouth 2 (two) times daily.  Dispense: 6 tablet; Refill: 5  Attention or concentration deficit Assessment & Plan: Dosage increased and alert us  if not content in the coming weeks.  Orders: -     Amphetamine -Dextroamphet ER; Take 1 capsule (30 mg total) by mouth every morning.  Dispense: 30 capsule; Refill: 0  Skin lesion Assessment & Plan: Advised a good skin exam annually either with me or her GYN.   Tobacco abuse Assessment & Plan: Discussion as above.  Chantix declined.     Return in about 6 months (around 04/02/2024) for chronic follow-up.    REDDING PONCE NORLEEN FALCON., MD

## 2023-11-02 ENCOUNTER — Other Ambulatory Visit (HOSPITAL_BASED_OUTPATIENT_CLINIC_OR_DEPARTMENT_OTHER): Payer: Self-pay | Admitting: Family Medicine

## 2023-11-02 DIAGNOSIS — R41840 Attention and concentration deficit: Secondary | ICD-10-CM

## 2023-11-02 MED ORDER — AMPHETAMINE-DEXTROAMPHET ER 30 MG PO CP24: 30.0000 mg | ORAL_CAPSULE | ORAL | 0 refills | Status: DC

## 2023-11-02 NOTE — Telephone Encounter (Unsigned)
 Copied from CRM [id number]. Topic: Clinical - Medication Refill >> Nov 02, 2023 10:59 AM Joesph B wrote: Medication: amphetamine -dextroamphetamine (ADDERALL XR) 30 MG 24 hr capsule  Has the patient contacted their pharmacy? Yes (Agent: If no, request that the patient contact the pharmacy for the refill. If patient does not wish to contact the pharmacy document the reason why and proceed with request.) (Agent: If yes, when and what did the pharmacy advise?)  This is the patient's preferred pharmacy:  [facility] [address] [place] - [address] [facility] [place] [address] Phone: [phone] Fax: [phone]  Is this the correct pharmacy for this prescription? Yes If no, delete pharmacy and type the correct one.   Has the prescription been filled recently? No  Is the patient out of the medication? No, one left  Has the patient been seen for an appointment in the last year OR does the patient have an upcoming appointment? Yes  Can we respond through MyChart? No, call patient   Agent: Please be advised that Rx refills may take up to 3 business days. We ask that you follow-up with your pharmacy.

## 2023-11-28 ENCOUNTER — Other Ambulatory Visit (HOSPITAL_BASED_OUTPATIENT_CLINIC_OR_DEPARTMENT_OTHER): Payer: Self-pay | Admitting: Family Medicine

## 2023-11-28 DIAGNOSIS — R41840 Attention and concentration deficit: Secondary | ICD-10-CM

## 2023-11-28 MED ORDER — AMPHETAMINE-DEXTROAMPHET ER 30 MG PO CP24: 30.0000 mg | ORAL_CAPSULE | ORAL | 0 refills | Status: DC

## 2023-11-28 NOTE — Telephone Encounter (Signed)
 Copied from CRM [id number]. Topic: Clinical - Medication Refill >> Nov 28, 2023  9:51 AM Berwyn MATSU wrote: Medication: amphetamine -dextroamphetamine (ADDERALL XR) 30 MG 24 hr capsule   Has the patient contacted their pharmacy? Yes (Agent: If no, request that the patient contact the pharmacy for the refill. If patient does not wish to contact the pharmacy document the reason why and proceed with request.) (Agent: If yes, when and what did the pharmacy advise?)  This is the patient's preferred pharmacy:  [facility] [address] [place] - [address] [facility] [place] [address] Phone: [phone] Fax: [phone]  Is this the correct pharmacy for this prescription? Yes If no, delete pharmacy and type the correct one.   Has the prescription been filled recently? Yes  Is the patient out of the medication? Yes  Has the patient been seen for an appointment in the last year OR does the patient have an upcoming appointment? Yes  Can we respond through MyChart? Yes  Agent: Please be advised that Rx refills may take up to 3 business days. We ask that you follow-up with your pharmacy.

## 2023-12-25 ENCOUNTER — Telehealth (HOSPITAL_BASED_OUTPATIENT_CLINIC_OR_DEPARTMENT_OTHER): Payer: Self-pay | Admitting: Family Medicine

## 2023-12-25 DIAGNOSIS — R41840 Attention and concentration deficit: Secondary | ICD-10-CM

## 2023-12-25 NOTE — Telephone Encounter (Unsigned)
 Copied from CRM #[id number]. Topic: Clinical - Medication Refill >> Dec 25, 2023 11:24 AM Wess RAMAN wrote: Medication: amphetamine -dextroamphetamine (ADDERALL XR) 30 MG 24 hr capsule   Has the patient contacted their pharmacy? Yes (Agent: If no, request that the patient contact the pharmacy for the refill. If patient does not wish to contact the pharmacy document the reason why and proceed with request.) (Agent: If yes, when and what did the pharmacy advise?)  This is the patient's preferred pharmacy:  [facility] [address], [place] - [address] [facility] [place] [address] Phone: [phone] Fax: [phone]  Is this the correct pharmacy for this prescription? Yes If no, delete pharmacy and type the correct one.   Has the prescription been filled recently? Yes  Is the patient out of the medication? Yes  Has the patient been seen for an appointment in the last year OR does the patient have an upcoming appointment? Yes  Can we respond through MyChart? Yes  Agent: Please be advised that Rx refills may take up to 3 business days. We ask that you follow-up with your pharmacy.

## 2023-12-27 ENCOUNTER — Other Ambulatory Visit (HOSPITAL_BASED_OUTPATIENT_CLINIC_OR_DEPARTMENT_OTHER): Payer: Self-pay | Admitting: Family Medicine

## 2023-12-27 DIAGNOSIS — R41840 Attention and concentration deficit: Secondary | ICD-10-CM

## 2023-12-27 MED ORDER — AMPHETAMINE-DEXTROAMPHET ER 30 MG PO CP24: 30.0000 mg | ORAL_CAPSULE | ORAL | 0 refills | Status: DC

## 2024-01-19 ENCOUNTER — Other Ambulatory Visit (HOSPITAL_BASED_OUTPATIENT_CLINIC_OR_DEPARTMENT_OTHER): Payer: Self-pay | Admitting: Family Medicine

## 2024-01-19 DIAGNOSIS — R41840 Attention and concentration deficit: Secondary | ICD-10-CM

## 2024-01-19 NOTE — Telephone Encounter (Signed)
 Copied from CRM #[id number]. Topic: Clinical - Medication Refill >> Jan 19, 2024 10:41 AM Joesph B wrote: Medication: amphetamine -dextroamphetamine (ADDERALL XR) 30 MG 24 hr capsule--- >>>> patient would like to increase her dosage, her pcp advised her to let him know if that's needed.  Please advise! Patient is going out of town next week.   Has the patient contacted their pharmacy? Yes (Agent: If no, request that the patient contact the pharmacy for the refill. If patient does not wish to contact the pharmacy document the reason why and proceed with request.) (Agent: If yes, when and what did the pharmacy advise?)  This is the patient's preferred pharmacy:  [facility] [address], [place] - [address] [facility] [place] [address] Phone: [phone] Fax: [phone]  Is this the correct pharmacy for this prescription? Yes If no, delete pharmacy and type the correct one.   Has the prescription been filled recently? Yes  Is the patient out of the medication? No  Has the patient been seen for an appointment in the last year OR does the patient have an upcoming appointment? Yes  Can we respond through MyChart? Yes  Agent: Please be advised that Rx refills may take up to 3 business days. We ask that you follow-up with your pharmacy.

## 2024-01-22 ENCOUNTER — Telehealth (HOSPITAL_BASED_OUTPATIENT_CLINIC_OR_DEPARTMENT_OTHER): Payer: Self-pay | Admitting: Family Medicine

## 2024-01-22 MED ORDER — AMPHETAMINE-DEXTROAMPHET ER 30 MG PO CP24: 30.0000 mg | ORAL_CAPSULE | ORAL | 0 refills | Status: DC

## 2024-01-22 NOTE — Telephone Encounter (Signed)
 Copied from CRM [id number]. Topic: Clinical - Prescription Issue >> Jan 22, 2024 11:25 AM Tiffany B wrote: Reason for CRM: Patient states she requested  PCP to increase her amphetamine -dextroamphetamine (ADDERALL XR) 30 MG 24 hr capsule but the same dosage was sent to the pharmacy today Patient would like a follow up call today regarding the status.   Please reference 01/19/2024 refill encounter for additional information.

## 2024-01-24 ENCOUNTER — Telehealth (HOSPITAL_BASED_OUTPATIENT_CLINIC_OR_DEPARTMENT_OTHER): Payer: Self-pay | Admitting: Family Medicine

## 2024-01-24 ENCOUNTER — Other Ambulatory Visit (HOSPITAL_BASED_OUTPATIENT_CLINIC_OR_DEPARTMENT_OTHER): Payer: Self-pay | Admitting: Family Medicine

## 2024-01-24 ENCOUNTER — Telehealth (HOSPITAL_BASED_OUTPATIENT_CLINIC_OR_DEPARTMENT_OTHER): Payer: Self-pay | Admitting: *Deleted

## 2024-01-24 DIAGNOSIS — R41840 Attention and concentration deficit: Secondary | ICD-10-CM

## 2024-01-24 MED ORDER — AMPHET-DEXTROAMPHET 3-BEAD ER 50 MG PO CP24: 50.0000 mg | ORAL_CAPSULE | ORAL | 0 refills | Status: DC

## 2024-01-24 NOTE — Telephone Encounter (Signed)
 Copied from CRM [id number]. Topic: Clinical - Prescription Issue >> Jan 22, 2024 11:25 AM [name] wrote: Reason for CRM: Patient states she requested  PCP to increase her amphetamine -dextroamphetamine (ADDERALL XR) 30 MG 24 hr capsule but the same dosage was sent to the pharmacy today Patient would like a follow up call today regarding the status.   Please reference 01/19/2024 refill encounter for additional information. >> Jan 23, 2024 11:55 AM [name] wrote: Pt calling back for status update in regards to previous message.  Requesting an increased dose of medication, Adderrall XR.  Pt reports same dosage of medication was sent to pharmacy.  Pt requesting a follow up call with status update as soon as possible, of when she can expect the increased dose of medication be submitted to pharmacy. Pt reports she is leaving to go out of town, and requesting a response, so she can pick p medication from pharmacy.   Pt aware of same day call back, can be reached at [phone].

## 2024-01-24 NOTE — Telephone Encounter (Signed)
 Copied from CRM [id number]. Topic: Clinical - Prescription Issue >> Jan 24, 2024  3:02 PM Deaijah H wrote: Reason for CRM: Patient called in stating amphetamine -dextroamphetamine 50 MG CP24 is not covered by insurance with the way it was sent in, but instead can be covered with by another way taking 2 25 MG capsules. Stated she's been trying to get medication for 3 days and is needing before she go out of town. Please call.

## 2024-01-25 NOTE — Telephone Encounter (Signed)
 Spoke with pt

## 2024-02-19 ENCOUNTER — Telehealth (HOSPITAL_BASED_OUTPATIENT_CLINIC_OR_DEPARTMENT_OTHER): Payer: Self-pay | Admitting: *Deleted

## 2024-02-19 ENCOUNTER — Other Ambulatory Visit (HOSPITAL_BASED_OUTPATIENT_CLINIC_OR_DEPARTMENT_OTHER): Payer: Self-pay | Admitting: Family Medicine

## 2024-02-19 DIAGNOSIS — R41840 Attention and concentration deficit: Secondary | ICD-10-CM

## 2024-02-19 MED ORDER — AMPHET-DEXTROAMPHET 3-BEAD ER 50 MG PO CP24: 50.0000 mg | ORAL_CAPSULE | ORAL | 0 refills | Status: DC

## 2024-02-19 NOTE — Telephone Encounter (Signed)
 Copied from CRM #[id number]. Topic: Clinical - Medication Refill >> Feb 19, 2024  3:48 PM Hadassah PARAS wrote: Medication: amphetamine -dextroamphetamine (please write the dosage in correctly so insurance can cover)  Has the patient contacted their pharmacy? No (Agent: If no, request that the patient contact the pharmacy for the refill. If patient does not wish to contact the pharmacy document the reason why and proceed with request.) (Agent: If yes, when and what did the pharmacy advise?)  This is the patient's preferred pharmacy:  [facility] [address] [place] - [address] [facility] [place] [address] Phone: [phone] Fax: [phone]  Is this the correct pharmacy for this prescription? Yes If no, delete pharmacy and type the correct one.   Has the prescription been filled recently? Yes  Is the patient out of the medication? No  Has the patient been seen for an appointment in the last year OR does the patient have an upcoming appointment? Yes  Can we respond through MyChart? No  Agent: Please be advised that Rx refills may take up to 3 business days. We ask that you follow-up with your pharmacy.

## 2024-02-22 ENCOUNTER — Telehealth (HOSPITAL_BASED_OUTPATIENT_CLINIC_OR_DEPARTMENT_OTHER): Payer: Self-pay | Admitting: Family Medicine

## 2024-02-22 NOTE — Telephone Encounter (Signed)
 Copied from CRM [id number]. Topic: Clinical - Prescription Issue >> Feb 21, 2024  4:58 PM Alfonso HERO wrote: Reason for CRM: patient calling to request lower dosage of Amphet-Dextroamphet 3-Bead ER 50 MG CP24 requesting 30 MG. She can't afford the higher dosage so she's not going to pick this one up.

## 2024-02-23 ENCOUNTER — Other Ambulatory Visit (HOSPITAL_BASED_OUTPATIENT_CLINIC_OR_DEPARTMENT_OTHER): Payer: Self-pay | Admitting: Family Medicine

## 2024-02-23 ENCOUNTER — Telehealth (HOSPITAL_BASED_OUTPATIENT_CLINIC_OR_DEPARTMENT_OTHER): Payer: Self-pay | Admitting: Family Medicine

## 2024-02-23 ENCOUNTER — Other Ambulatory Visit (HOSPITAL_COMMUNITY): Payer: Self-pay

## 2024-02-23 ENCOUNTER — Encounter (HOSPITAL_BASED_OUTPATIENT_CLINIC_OR_DEPARTMENT_OTHER): Payer: Self-pay | Admitting: Family Medicine

## 2024-02-23 DIAGNOSIS — R41840 Attention and concentration deficit: Secondary | ICD-10-CM

## 2024-02-23 MED ORDER — AMPHET-DEXTROAMPHET 3-BEAD ER 50 MG PO CP24: 50.0000 mg | ORAL_CAPSULE | ORAL | 0 refills | Status: AC

## 2024-02-23 NOTE — Telephone Encounter (Signed)
 Patient came in thinking her appointment was for today, to discuss her medication (Amphet-Dextroamphet 3-Bead ER 50 MG CP24 [[id number]] ). She stated the higher dose is not covered by her insurance and she is completely out.   Patient does have an appointment for next Friday.  Pharmacy -  [facility] [address] [place] - [address]

## 2024-02-23 NOTE — Telephone Encounter (Signed)
 Copied from CRM [id number]. Topic: Clinical - Prescription Issue >> Feb 23, 2024  9:44 AM Nessti S wrote: Reason for CRM: rachelle called on behalf of pt about prescribing amphetamine  dextroamphetamine (ADDERALL XR) 20 MG 24 hr capsule because her ins does not cover Amphet-Dextroamphet 3-Bead ER 50 MG CP24. She would like a call back soon as possible because pt is out of meds.    ----------------------------------------------------------------------- From previous Reason for Contact - Medication Refill: Medication: amphetamine  dextroamphetamine (ADDERALL XR) 20 MG 24 hr capsule  Has the patient contacted their pharmacy? Yes (Agent: If no, request that the patient contact the pharmacy for the refill. If patient does not wish to contact the pharmacy document the reason why and proceed with request.) (Agent: If yes, when and what did the pharmacy advise?)  This is the patient's preferred pharmacy:  [facility] [address], [place] - [address] [facility] [place] [address] Phone: [phone] Fax: [phone]  Is this the correct pharmacy for this prescription? Yes If no, delete pharmacy and type the correct one.   Has the prescription been filled recently? No  Is the patient out of the medication? Yes  Has the patient been seen for an appointment in the last year OR does the patient have an upcoming appointment? Yes  Can we respond through MyChart? Yes  Agent: Please be advised that Rx refills may take up to 3 business days. We ask that you follow-up with your pharmacy.

## 2024-03-01 ENCOUNTER — Ambulatory Visit (HOSPITAL_BASED_OUTPATIENT_CLINIC_OR_DEPARTMENT_OTHER): Admitting: Family Medicine
